# Patient Record
Sex: Female | Born: 1975
Health system: Southern US, Community
[De-identification: ages and names within clinical notes are randomized; demographics above are authoritative.]

## PROBLEM LIST (undated history)

## (undated) ENCOUNTER — Encounter (HOSPITAL_BASED_OUTPATIENT_CLINIC_OR_DEPARTMENT_OTHER): Payer: Self-pay

## (undated) ENCOUNTER — Ambulatory Visit (HOSPITAL_BASED_OUTPATIENT_CLINIC_OR_DEPARTMENT_OTHER): Payer: Self-pay | Admitting: Obstetrics & Gynecology

## (undated) DIAGNOSIS — N840 Polyp of corpus uteri: Secondary | ICD-10-CM

## (undated) DIAGNOSIS — E785 Hyperlipidemia, unspecified: Secondary | ICD-10-CM

## (undated) DIAGNOSIS — Z148 Genetic carrier of other disease: Secondary | ICD-10-CM

## (undated) DIAGNOSIS — T7840XA Allergy, unspecified, initial encounter: Secondary | ICD-10-CM

## (undated) DIAGNOSIS — R569 Unspecified convulsions: Secondary | ICD-10-CM

## (undated) DIAGNOSIS — D649 Anemia, unspecified: Secondary | ICD-10-CM

## (undated) DIAGNOSIS — E119 Type 2 diabetes mellitus without complications: Secondary | ICD-10-CM

## (undated) DIAGNOSIS — K219 Gastro-esophageal reflux disease without esophagitis: Secondary | ICD-10-CM

## (undated) DIAGNOSIS — G4730 Sleep apnea, unspecified: Secondary | ICD-10-CM

## (undated) DIAGNOSIS — Z789 Other specified health status: Secondary | ICD-10-CM

## (undated) DIAGNOSIS — M1990 Unspecified osteoarthritis, unspecified site: Secondary | ICD-10-CM

## (undated) DIAGNOSIS — F4320 Adjustment disorder, unspecified: Secondary | ICD-10-CM

## (undated) DIAGNOSIS — F32A Depression, unspecified: Secondary | ICD-10-CM

## (undated) DIAGNOSIS — E079 Disorder of thyroid, unspecified: Secondary | ICD-10-CM

## (undated) DIAGNOSIS — N946 Dysmenorrhea, unspecified: Secondary | ICD-10-CM

## (undated) DIAGNOSIS — G43909 Migraine, unspecified, not intractable, without status migrainosus: Secondary | ICD-10-CM

## (undated) DIAGNOSIS — R519 Headache, unspecified: Secondary | ICD-10-CM

## (undated) DIAGNOSIS — I10 Essential (primary) hypertension: Secondary | ICD-10-CM

## (undated) HISTORY — DX: Allergy, unspecified, initial encounter: T78.40XA

## (undated) HISTORY — DX: Gastro-esophageal reflux disease without esophagitis: K21.9

## (undated) HISTORY — PX: WISDOM TOOTH EXTRACTION: SHX21

## (undated) HISTORY — DX: Unspecified convulsions: R56.9

## (undated) HISTORY — DX: Polyp of corpus uteri: N84.0

## (undated) HISTORY — DX: Genetic carrier of other disease: Z14.8

## (undated) HISTORY — DX: Unspecified osteoarthritis, unspecified site: M19.90

## (undated) HISTORY — PX: HAND SURGERY: SHX662

## (undated) HISTORY — PX: ESOPHAGOGASTRODUODENOSCOPY ENDOSCOPY: SHX5814

## (undated) HISTORY — DX: Type 2 diabetes mellitus without complications: E11.9

## (undated) HISTORY — PX: COLONOSCOPY: SHX5228

## (undated) HISTORY — DX: Headache, unspecified: R51.9

## (undated) HISTORY — DX: Depression, unspecified: F32.A

## (undated) HISTORY — PX: PR CHOLECYSTECTOMY: 47600

## (undated) HISTORY — DX: Dysmenorrhea, unspecified: N94.6

## (undated) HISTORY — DX: Adjustment disorder, unspecified: F43.20

## (undated) HISTORY — DX: Migraine, unspecified, not intractable, without status migrainosus: G43.909

## (undated) HISTORY — PX: PR COLPOSCOPY CERVIX BX CERVIX & ENDOCRV CURRETAGE: 57454

## (undated) HISTORY — PX: PR TONSILLECTOMY & ADENOIDECTOMY <AGE 12: 42820

## (undated) HISTORY — PX: PR UNLISTED PROCEDURE INNER EAR: 69949

## (undated) HISTORY — DX: Essential (primary) hypertension: I10

## (undated) HISTORY — DX: Disorder of thyroid, unspecified: E07.9

## (undated) HISTORY — DX: Anemia, unspecified: D64.9

## (undated) SURGERY — HYSTEROSCOPY, WITH UTERINE POLYP EXCISION, USING MORCELLATOR
Anesthesia: Surgeon Has No Preference | Site: Uterus

## (undated) MED ORDER — OMEPRAZOLE 40 MG OR CPDR
40.0000 mg | DELAYED_RELEASE_CAPSULE | Freq: Every day | ORAL | 0 refills | Status: AC
Start: 2022-07-01 — End: ?

## (undated) MED ORDER — ZOLOFT 100 MG OR TABS
ORAL_TABLET | ORAL | Status: DC
Start: 1998-03-28 — End: 2021-09-09

## (undated) MED ORDER — METRONIDAZOLE 250 MG OR TABS
ORAL_TABLET | ORAL | Status: DC
Start: 1999-05-16 — End: 2021-09-09

## (undated) MED ORDER — EFFEXOR XR 37.5 MG OR CP24
EXTENDED_RELEASE_CAPSULE | ORAL | Status: AC
Start: 1998-02-21 — End: 1998-03-28

## (undated) MED ORDER — ZOLOFT 100 MG OR TABS
ORAL_TABLET | ORAL | Status: AC
Start: 1997-12-31 — End: 1998-02-21

## (undated) MED ORDER — ZOLOFT 100 MG OR TABS
ORAL_TABLET | ORAL | Status: AC
Start: 1998-02-21 — End: 1998-03-28

## (undated) MED ORDER — LEVAQUIN 250 MG OR TABS
ORAL_TABLET | ORAL | Status: DC
Start: 1998-01-15 — End: 2021-09-09

## (undated) MED ORDER — ZOLOFT 50 MG OR TABS
ORAL_TABLET | ORAL | Status: DC
Start: 1997-12-31 — End: 2021-09-09

## (undated) MED ORDER — EFFEXOR XR 37.5 MG OR CP24
EXTENDED_RELEASE_CAPSULE | ORAL | Status: AC
Start: 1998-01-22 — End: 1998-02-21

## (undated) MED ORDER — EFFEXOR XR 37.5 MG OR CP24
EXTENDED_RELEASE_CAPSULE | ORAL | Status: DC
Start: 1998-03-28 — End: 2021-09-09

## (undated) MED ORDER — TRAZODONE HCL 50 MG OR TABS
ORAL_TABLET | ORAL | Status: DC
Start: 1998-01-22 — End: 2021-09-09

## (undated) MED ORDER — ZOLOFT 50 MG OR TABS
ORAL_TABLET | ORAL | Status: AC
Start: 1997-12-03 — End: 1997-12-31

## (undated) MED ORDER — ZOLOFT 100 MG OR TABS
ORAL_TABLET | ORAL | Status: AC
Start: 1997-12-17 — End: 1997-12-31

## (undated) NOTE — H&P (Signed)
Formatting of this note is different from the original.  [facility]:  PRE-OP HISTORY AND PHYSICAL EXAM    ASSESSMENT & PLAN:    (Z01.818) Preop examination  (primary encounter diagnosis)  (N94.6) Dysmenorrhea  (N84.1) Endocervical polyp  Plan: TLH, BS, cystoscopy    Procedure, indications, and potential benefits discussed with the patient.  Risks discussed, including but not limited to:  Anesthesia complications  Death  Infection  Hemorrhage  Transfusion  Uterine/Cervical perforation  Pain/Chronic Pain  Thromboembolic events (DVT/PE)  Wound infection/separation  Injury to pelvic/abdominal organs including bowel, bladder, ureters, etc.    Alternatives and their risks discussed including:  Expectant management  Alternative medical tx: hormones, endometrial ablation     Preop medications sent via mail order.     (I10) Primary hypertension  Comment: losartan and HCTZ daily  Plan: hold losartan morning of surgery, can continue HCTZ    (E05.00) Graves' disease  Plan: continue levo daily    (G47.33) Obstructive sleep apnea (adult) (pediatric)  Comment: has not been using cpap machine    Perioperative medication management:   - Will hold losartan morning of surgery    Medical conditions optimized; patient's health status appropriate for proceeding to surgery    SUBJECTIVE INFORMATION:     Nicole [name] is a 70 year old female seen today for a pre-operative history and physical exam.     Surgery Information       Future Procedures (Tomorrow to 05/24/2022)         Date Time Procedures Providers       05/01/2022  7:15 AM HYSTERECTOMY TOTAL W BILATERAL SALPINGECTOMY, LAPAROSCPICCYSTOSCOPY Corrinne Eagle                Patient's husband will accompany on the day of surgery.    Other considerations: nothing noted          HISTORICAL INFORMATION:     Problem List  Patient Active Problem List   Diagnosis    Benign neoplasm of colon, unspecified    Conductive hearing loss, bilateral    Dysmenorrhea     Endocervical polyp    Graves' disease    Hypertension    Hyperhidrosis    Gastroesophageal reflux disease    Hypothyroidism, postablative    Morbid obesity    Marijuana use    Major depressive disorder, single episode, in partial or unspecified remission    Morbid obesity with body mass index of 40.0-44.9 in adult    Obstructive sleep apnea (adult) (pediatric)    Oligomenorrhea    PTSD (post-traumatic stress disorder)    Recurrent major depressive disorder, in partial remission    Tension headache    Chronic foot pain, left    Fibromyalgia    Vasomotor symptoms due to menopause    Varicose veins of left lower extremity with pain    Healthcare maintenance     Medications  Outpatient Medications Marked as Taking for the 04/14/22 encounter (Office Visit) with Corrinne Eagle, MD   Medication Sig Dispense Refill    losartan (COZAAR) 50 mg tablet       triamcinolone (KENALOG) 0.1 % topical ointment Apply to affected area(s) 2 times daily as needed 30 g 1    buPROPion (WELLBUTRIN SR) 150 mg sustained release tablet Take 1 tablet by mouth 2 times daily      levothyroxine 0.175 mg tablet Take 175 mcg by mouth      omeprazole (PRILOSEC) 40 mg delayed release  capsule Take 1 capsule by mouth daily      gabapentin (NEURONTIN) 300 mg capsule 600 mg daily      hydroCHLOROthiazide (HYDRODIURIL) 25 mg tablet Take 25 mg by mouth daily      aspirin-acetaminophen-caffeine (EXCEDRIN MIGRAINE) 250-250-65 mg tablet Take 1 tab by mouth      DULoxetine (CYMBALTA) 20 mg delayed release capsule Take 3 capsules by mouth daily      potassium chloride (KLOR-CON M20) 20 mEq extended release tablet 20 mEq       Current Facility-Administered Medications for the 04/14/22 encounter (Office Visit) with Corrinne Eagle, MD   Medication Dose Route Frequency Provider Last Rate Last Admin    medroxyPROGESTERone (DEPO-PROVERA) injectable 150 mg  150 mg Intramuscular Q 11-13 weeks Drucie Ip, RN   150 mg at 02/20/22 1716     Allergies  Allergies    Allergen Reactions    Nortriptyline Palpitations and Shortness of breath    Citalopram Other     Easy bruising  Easy bruising  Easy bruising  Easy bruising  Easy bruising  Easy bruising    Easy bruising Easy bruising Easy bruising    Easy bruising Easy bruising  Easy bruising     Propranolol Other     Helped palpitations but not anxiety and caused fatigue  Helped palpitations but not anxiety and caused fatigue  Helped palpitations but not anxiety and caused fatigue  Helped palpitations but not anxiety and caused fatigue  Helped palpitations but not anxiety and caused fatigue  Helped palpitations but not anxiety and caused fatigue    Helped palpitations but not anxiety and caused fatigue Helped palpitations but not anxiety and caused fatigue Helped palpitations but not anxiety and caused fatigue    Helped palpitations but not anxiety and caused fatigue Helped palpitations but not anxiety and caused fatigue  Helped palpitations but not anxiety and caused fatigue      Social History  Social History     Tobacco Use    Smoking status: Former     Types: Cigarettes     Start date: 1992     Quit date: 2013     Years since quitting: 11.2    Smokeless tobacco: Never   Substance Use Topics    Alcohol use: Not Currently     Past Medical History  Past Surgical History  Family History    Relevant history, medical conditions, medications and allergies reviewed and updated as appropriate.    RISK STRATIFICATION:     Anesthesia History: No problems with previous anesthesia    Activity and Exercise Tolerance: Moderate activity (4-5 Mets): Able to climb 2 flights of stairs or walk up a small hill without stopping from SOB; able to participate in moderate activities such as golf, bowling or dancing    Prior cardiac stress testing on file:    REVISED CARDIAC RISK INDEX  Risk Factors:  High risk type of surgery: no       (Includes vascular surgery and open intraperitoneal or intrathoracic procedures)  History of ischemic heart disease  defined as below: no  History of myocardial infarction or   Positive exercise stress test or   Current chest pain considered to be due to myocardial ischemia or   Use of nitrate therapy or   EKG with pathological Q-waves         (Prior coronary revascularization procedures do not strictly count unless 1 other criterion for ischemic heart disease is present)  History of heart  failure: no  History of cerebrovascular disease: no  Diabetes mellitus requiring treatment with insulin: no  Preoperative serum creatinine greater than 2.0: no    Interpretation:  Rate of myocardial infarction, pulmonary edema, ventricular fibrillation, primary cardiac arrest, and complete heart block:  No Risk Factors - 0.4%    PHYSICAL EXAM:     Visit Vitals  BP 132/91   Pulse 94   Temp 98.1 F (36.7 C) (Temporal)   Ht 5\' 9"  (1.753 m)   SpO2 97%   BMI 41.20 kg/m     General Appearance: awake, alert and oriented  Cardiovascular: Regular rate and rhythm. Normal S1, S2 without S3, S4, click, rubs, or murmur, bilateral carotids without bruits, extremities without edema  Lungs: clear to auscultation and percussion without crackles, rhonchi or wheezes  Extremities: normal appearance    LABORATORY STUDIES:     No order of MRSA CULTURE is found.     No results found for: "MRSA"    MRSA screening: Completed today    Lab Results   Component Value Date    A1C 5.5 02/20/2022     Recent ultrasound (09/2021) shows 2 small subserosal fundal fibroids and echogenic mass anterior cervical wall, possibly small polyp. EMB 09/2021 with [facility] - had an EMB which was normal.     PAIN MANAGEMENT:     The following counseling points were discussed today:  The goal of post-operative pain treatment is not to relieve all pain, but rather focus on improving function to allow for the ability to do basic daily activities.  First line pain treatments are Acetaminophen (Tylenol) and NSAIDs (such as ibuprofen or naproxen).  Opioids may or may not be needed for post-op pain  control.   Depending on the type of surgery/procedure, opioids may or may not be needed for post op pain control. If opioids are prescribed, it may be for only a few days, 4-7 days, or up to 14 days.    Do not expect the need for opioids longer than 2 weeks in most cases.   If opioids are prescribed, understand that there are multiple side effects including nausea, vomiting, constipation, risk of dependence, addiction, and potential for overdose.  The operating surgeon's office is responsible for prescribing narcotics if needed, up to and generally no longer than 6 weeks in most cases.   Opioid prescribing more than 6 weeks post op has been linked to long term opioid misuse/abuse and addiction potential  In the case where refills are needed, patient to call the surgeon's clinic at least 48 hours in advance. This will allow time for possible transfer of hard copy to patient's preferred [facility] pharmacy for clinic pick-up as necessary.    References: www.opioidprescribing.info   http://agencymeddirectors.FindSpin.nl.pdf     Corrinne Eagle, MD  04/14/2022      Electronically signed by Corrinne Eagle, MD at 04/14/2022  2:12 PM PDT

## (undated) NOTE — Progress Notes (Signed)
Formatting of this note is different from the original.  Visit Prep    Provider Needs to Reconcile: None    Patient's main concern: Pre-op Exam (Pre op teaching)     Other needs or issues hoping to address?   none    Care reminders were reviewed:  Yes:  none    Orders Placed This Encounter    losartan (COZAAR) 50 mg tablet     Health Maintenance Needs    - A blood test to check the level of potassium in your system.  You need   this test every year because of certain medicine(s) you take.     - A blood test to check the level of creatinine in your system.  This test   is required every year to check your kidney health, because of medicine(s)   you take.    - A follow up test for high blood pressure.      Unaddressed Conditions          Smoking Tobacco Use: Former    Medications reviewed:       Tue Apr 14, 2022  1:43 PM       Allergies reviewed:         Apr 14, 2022  1:43 PM       History updated: no changes     Questionnaires completed:       Patient offered chaperone: Not applicable    Patient mobility: Ambulatory  Patient accompanied by: self      Electronically signed by Aura Dials, MA at 04/14/2022  2:12 PM PDT

## (undated) NOTE — Progress Notes (Signed)
Formatting of this note might be different from the original.  Two pt.identifier is met for Depo-Provera 150 mg IM to be administered; please see medication administration record (MAR) for additional information. Pt.tol.injection well into the LDL. A reminder card is given to pt.w/dates of next due injection. Pt.states she was told by her provider that she doesn't have to have a negative pregnancy test prior to this injection. See provider message.  Reviewed with Inj Room Manager at [facility]. If Dr. Cathie Hoops can document in chart that it is ok to bypass urine pregnancy test, they will be able to administer. Dr. Cathie Hoops added note to today's chart note.   Per Dr. Cathie Hoops:  "urine POCT canceled as patient has not had intercourse with a female in over 10 years and most recently only had female partners. OKAY TO ADMINISTER DEPO PROVERA WITHOUT URINE POCT".   Pt.left the IR in NAD.  Rick Duff, LPN    Electronically signed by Rick Duff, LPN at 16/11/9602  5:24 PM PST

---
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
## 1997-09-06 ENCOUNTER — Ambulatory Visit (INDEPENDENT_AMBULATORY_CARE_PROVIDER_SITE_OTHER): Payer: No Typology Code available for payment source

## 1997-09-06 ENCOUNTER — Encounter (INDEPENDENT_AMBULATORY_CARE_PROVIDER_SITE_OTHER): Payer: Self-pay | Admitting: Internal Medicine

## 1997-09-06 DIAGNOSIS — T1490XA Injury, unspecified, initial encounter: Secondary | ICD-10-CM

## 1997-09-06 NOTE — Progress Notes (Addendum)
Addended by: Octavia Heir on: 09/18/1997,12:23 PM  Modules accepted: Order Summary, Change LOS

## 1997-09-06 NOTE — Nursing Note (Signed)
>>   Nicole [name], Nicole [name]     09/06/1997   3:14 pm  Nursing Back/Neck Pain    S:  Pt. was sitting on a stool when she developed midback pain.    Pain worsened with ambulation, position did not relieve pain.  Pt.   slept on the floor, on her stomach last night.  This morning when   she attempted to move the pain worsened again.  1.  The pain was sudden in onset.  yes: Was sitting on a stool when   an intense ache started in her back.  2.  The pain is secondary to trauma.  no  3.  Radiating pain?  no  4.  Numbness to extremities.  no  5.  Tingling to extremities.  no  6.  SOB?                      no  7.  Pain in inspiration?      no  8.  Pain between shoulder blades?  yes: Pt. has pain under her   right shoulder blade.  Pt. has bilateral breath sounds and no SOB.  9.  Loss of bladder or bowel control?  no  10.  Fever?   no  11.  CVA tenderness?  no  12.  Abdominal pain?  no  13.  Anxiety?         no  14.  Worker's Comp. Claim filed?  no  15.  Difficulty sleeping or normal daily function impaired?  no    Duration of problem: 12  hours    Causal Factors: Movement.     Level of Activity Before: n/a   Level of Activity After: n/a  Self treatment? yes. Ibuprofen.  Results: Some relief    O:  Location of palpable pain: None    A:  Back/neck pain  P:  1. Educated patient on theories behind ice/heat treatment.      2. Advised to use home ice massage, 3-5 minutes QID or more up   to Q hour.          (Ice packs can be substituted and applied for 30 minutes.)      3. Advised on use of OTC analgesic medications and given   appropriate information handout.      4.  Patient given back book handout with education about good   body mechanics.      5.  Rest in comfortable position.  If must sit, get up to walk   q 15 minutes.        Follow-up in 10-14  days for RECHECK. If symptoms worsen or if she   should develop fever, dysurea, radiating pain or SOB, return   sooner.

## 1997-10-12 ENCOUNTER — Ambulatory Visit (INDEPENDENT_AMBULATORY_CARE_PROVIDER_SITE_OTHER): Payer: No Typology Code available for payment source | Admitting: Family Medicine

## 1997-10-12 ENCOUNTER — Ambulatory Visit (INDEPENDENT_AMBULATORY_CARE_PROVIDER_SITE_OTHER): Payer: Self-pay | Admitting: Family Medicine

## 1997-10-12 DIAGNOSIS — S8000XA Contusion of unspecified knee, initial encounter: Secondary | ICD-10-CM

## 1997-10-12 NOTE — Progress Notes (Signed)
This 22 year old woman is here for bilateral knee pain.  S: She tripped running up stairs 10 days ago and fell onto both knees. She landed more heavily on her left knee. She noted bruising and swelling over the left knee that resolved over the ensuing 4-5 days. Two days ago she noted a small area of new bruising over the left patella; there is more pai today after going dancing last night. She continues to experience pain on stairs, kneeling, squatting, and with lots of walking. 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 She denies previous injury to either knee. She is doing no regular exercise activity over the summer; during the school she does water aerobics, which does not bother her knees. Running and aerobics do cause bilateral knee pain.  She is in good health, takes no medications, and has a negative past medical history.  O: Knee exam is similar bilaterally. There is a small, fading echymosis over the inferior left patella. There is no effusion; FROM without pain. There is tenderness over the patella without evidence of prepatellar bursal fluid. There is no joint line tenderness; McMurray test is negative. Lachman test is negative as is the anterior and posterior drawer tests. There is no pain or laxity with valgus or varus stress.  A: Contusion to the patellae, more severe on the left. There is no evidence of internal derangement.  P: Reassurance; symptomatic treatment; increase activity as tolerated.  A: Contusion

## 1997-10-12 NOTE — Nursing Note (Signed)
>>   Nicole [name], Nicole [name]     10/12/1997   10:18 am  Pt was walking up some stairs when she rammed her left knee into   the stairs.

## 1997-11-08 ENCOUNTER — Telehealth (INDEPENDENT_AMBULATORY_CARE_PROVIDER_SITE_OTHER): Payer: Self-pay | Admitting: Mental Health

## 1997-11-08 DIAGNOSIS — F4320 Adjustment disorder, unspecified: Secondary | ICD-10-CM

## 1997-11-08 NOTE — Telephone Encounter (Addendum)
Addended by: Edmonia Caprio on: 02/15/1998,6:04 PM  Modules accepted: Order Summary    Addended by: Edmonia Caprio on: 02/15/1998,6:01 PM  Modules accepted: Order Summary    Addended by: Edmonia Caprio on: 11/13/1997,4:02 PM  Modules accepted: Order Summary    Addended by: Michel Santee on: 11/12/1997,12:57 PM   Comment: Pt. called today, asked about referral, no referral yet. I told her she could CB to check though. MEK  Modules accepted: Progress Notes    D: Nicole [name] called to inquire if we have received a referral from her therapist in the community Nicole [name]) yet. I remembered receiving voice message from the therapist a couple of weeks ago - at that time the therapist did not reveal Khaila's name because ROI   was not done yet - and LMM with the therapist with our procedure of referral and fax number.     Action: I checked in the file cabinet and the computer, but no referral from her yet. LMM with Tresa Endo asking her to contact her therapist again, and to have her fax to [facility].

## 1997-11-13 DIAGNOSIS — F4320 Adjustment disorder, unspecified: Secondary | ICD-10-CM | POA: Insufficient documentation

## 1997-11-15 ENCOUNTER — Telehealth (INDEPENDENT_AMBULATORY_CARE_PROVIDER_SITE_OTHER): Payer: Self-pay | Admitting: Clinical

## 1997-11-15 DIAGNOSIS — F4320 Adjustment disorder, unspecified: Secondary | ICD-10-CM

## 1997-11-15 NOTE — Telephone Encounter (Addendum)
Addended by: Michel Santee on: 11/19/1997,10:12 AM   Comment: Made a medeval for 12/03/97 with C.Chavez see long narrative from current therapist in file cabinet alpha. MEK  Modules accepted: Order Summary, Progress Notes    Addended by: Michel Santee on: 11/19/1997,9:21 AM  Modules accepted: Progress Notes    Addended by: Michel Santee on: 11/19/1997,9:19 AM   Comment: NOTE CHECK INSURANCE Got the referral, called Tresa Endo to set up medeval LM to call beeper MEK  Modules accepted: Progress Notes    Giannamarie called again today inquiring about a referral for her by her Therapist, Beau Fanny of [facility]. She would like to see someone here to discuss about medication. After searching for the referral letter, I left her a message on her ans. machine informing her of no referral letter received in the mail or by fax from her therapist. I left her our address and fax number again.

## 1997-12-03 ENCOUNTER — Ambulatory Visit (INDEPENDENT_AMBULATORY_CARE_PROVIDER_SITE_OTHER): Payer: No Typology Code available for payment source | Admitting: Psychiatry

## 1997-12-03 DIAGNOSIS — IMO0002 Reserved for concepts with insufficient information to code with codable children: Secondary | ICD-10-CM

## 1997-12-03 DIAGNOSIS — F418 Other specified anxiety disorders: Secondary | ICD-10-CM | POA: Insufficient documentation
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
## 1997-12-17 ENCOUNTER — Ambulatory Visit (INDEPENDENT_AMBULATORY_CARE_PROVIDER_SITE_OTHER): Payer: Self-pay | Admitting: Psychiatry

## 1997-12-17 DIAGNOSIS — IMO0002 Reserved for concepts with insufficient information to code with codable children: Secondary | ICD-10-CM

## 1997-12-17 NOTE — Progress Notes (Signed)
Nicole [name] reports not doing any better. Cont to feel quite depressed. Has no energy. Sleep and appetite are O.K. Is taking several courses and is involved in a practicum at the [employer]. Recently took time off from practicum in order to "catch up" in her school work. Is Seeing [name]. Hamack in weekly therapy but was unable to see her last wk due to Nicole Hamack's illness. Relationship is going well.  O: Alert, cooperative  A: Depression persists  P: Increase med to 100mg /d Zoloft

## 1997-12-31 ENCOUNTER — Ambulatory Visit (INDEPENDENT_AMBULATORY_CARE_PROVIDER_SITE_OTHER): Payer: No Typology Code available for payment source | Admitting: Psychiatry

## 1997-12-31 DIAGNOSIS — IMO0002 Reserved for concepts with insufficient information to code with codable children: Secondary | ICD-10-CM

## 1997-12-31 NOTE — Progress Notes (Signed)
Ms C reports feeling better. Mood is "just barely normal." Sleep is fairly good altho at times has difficulty falling asleep. Appetite remains down. Energy level is adequate. Cont to see C Hamack regularly. Thanksgiving holiday was stressful. Catching up on school work and practicum. Only taking 12credits.   O: Alert, cooperative  A: Stable  P: Will increase medication slightly to 125mg  Zoloft for 2 weeks and possibly to 150mg /d.

## 1998-01-14 ENCOUNTER — Ambulatory Visit (INDEPENDENT_AMBULATORY_CARE_PROVIDER_SITE_OTHER): Payer: No Typology Code available for payment source | Admitting: Nurse Practitioner

## 1998-01-14 VITALS — BP 124/84

## 1998-01-14 DIAGNOSIS — L293 Anogenital pruritus, unspecified: Secondary | ICD-10-CM

## 1998-01-14 DIAGNOSIS — R300 Dysuria: Secondary | ICD-10-CM

## 1998-01-14 LAB — URINALYSIS COMPLETE, URINE
Bilirubin (Qual), URN: NEGATIVE
Glucose (Qual), URN: NEGATIVE g/dL
Ketones, URN: NEGATIVE mg/dL
Nitrite, URN: NEGATIVE
Occult Blood, URN: NEGATIVE
Protein (Alb Semiquant), URN: NEGATIVE mg/dL
RBC, URN: NEGATIVE /HPF
Specific Gravity, URN: 1.02 g/mL (ref 1.005–1.03)
Urobilinogen, URN: NORMAL EHRLICH UNITS
pH, URN: 6.5 (ref 5.0–8.0)

## 1998-01-14 NOTE — Progress Notes (Signed)
Nicole [name] a 22 year old female presents today for discomfort with urination on and off for the last 3 wks. Lasted 5 d then began again 2 d ago. Describes it as not really a 'burning" but at the end of urinary stream she gets the sensation she has to go some more so bears down -feels a 'pain' then relaxes and it disappears.  reports full STD testing 6 wks ago at [facility]- denies any new partners since,  No hx UTI  Meds as of 01/14/1998:  ORTHO TRI-CYCLEN TABS OR 1 TAB PO ONCE DAILY  ZOLOFT TABS 100 MG OR 1 TABLET DAILY  ZOLOFT TABS 50 MG OR one half to one tab qd    Patient's last menstrual period was 01/07/1998.  No past hx of UTI  Notes some dyspareunia with intromission inconsistently. Last IC was 4 d ago- no pain- but some introital burning after- has lessened but still present.  O:  abd: mild tenderness over suprapubic area  ext gen: shallow fissuring at ML introitus  vagina: mod amt white thick disch  cx: no inflammation  no CMT  Ut , adnexae WNL  KOH/NS:  no yeast trich or clue  P:   will observe vulvar burning- no IC for another 5 d- RTC if sx of irritation persist  call tomorrow for UA results

## 1998-01-15 ENCOUNTER — Telehealth (INDEPENDENT_AMBULATORY_CARE_PROVIDER_SITE_OTHER): Payer: Self-pay | Admitting: Nurse Practitioner

## 1998-01-15 ENCOUNTER — Other Ambulatory Visit (INDEPENDENT_AMBULATORY_CARE_PROVIDER_SITE_OTHER): Payer: Self-pay | Admitting: Nurse Practitioner

## 1998-01-15 DIAGNOSIS — N3000 Acute cystitis without hematuria: Secondary | ICD-10-CM

## 1998-01-15 NOTE — Telephone Encounter (Signed)
>>   Nicole [name] 01/15/1998 02:49 pm  spoke with Tresa Endo- she is still having sx ? a little better- 1+ WBC,   trace LE- based on sx and exam yesterday will rx- rx called to [facility]   pharmacy    >> Nicole [name] 01/15/1998 01:36 pm  left message    >> CALL RECEIVED. Contact: patient  >> Deloria Lair, MICHELLE 01/15/1998 11:48 am  Nicole [name] lost the number that was given to her yesterday. She called to   give [facility] her number to be reached today [phone] extension [phone].

## 1998-01-18 ENCOUNTER — Ambulatory Visit (INDEPENDENT_AMBULATORY_CARE_PROVIDER_SITE_OTHER): Payer: Self-pay

## 1998-01-22 ENCOUNTER — Ambulatory Visit (INDEPENDENT_AMBULATORY_CARE_PROVIDER_SITE_OTHER): Payer: No Typology Code available for payment source | Admitting: Psychiatry

## 1998-01-22 DIAGNOSIS — IMO0002 Reserved for concepts with insufficient information to code with codable children: Secondary | ICD-10-CM

## 1998-01-22 NOTE — Progress Notes (Signed)
Not doing well. Side-effects grew worse: lots of insomnia and nausea. Cut back on meds. Has felt more depressed. Has fd herself feeling very frustrated and irritated. Eating a lot. Tired w/ no energy. Got one incomplete but did well in other course. Has cont to see therapist C. Hamack weekly.   O: alert, cooperative; fighting back tears  A: Depression recurring  P: Add Effexor and Trazodone; Advise the pt to see her therapist more often, possibly twice a week.

## 1998-01-31 ENCOUNTER — Ambulatory Visit (INDEPENDENT_AMBULATORY_CARE_PROVIDER_SITE_OTHER): Payer: No Typology Code available for payment source

## 1998-01-31 ENCOUNTER — Ambulatory Visit (INDEPENDENT_AMBULATORY_CARE_PROVIDER_SITE_OTHER): Payer: Self-pay | Admitting: Nurse Practitioner

## 1998-01-31 ENCOUNTER — Ambulatory Visit (INDEPENDENT_AMBULATORY_CARE_PROVIDER_SITE_OTHER): Payer: No Typology Code available for payment source | Admitting: Psychiatry

## 1998-01-31 ENCOUNTER — Ambulatory Visit (INDEPENDENT_AMBULATORY_CARE_PROVIDER_SITE_OTHER): Payer: No Typology Code available for payment source | Admitting: Sports Medicine

## 1998-01-31 DIAGNOSIS — IMO0002 Reserved for concepts with insufficient information to code with codable children: Secondary | ICD-10-CM

## 1998-01-31 DIAGNOSIS — M79609 Pain in unspecified limb: Secondary | ICD-10-CM

## 1998-01-31 NOTE — Progress Notes (Signed)
Note dict    Transcription accepted by Carolann Littler on 02/05/1998 at 10:25 AM  ------  S: Nicole [name] is a 22 year old female who presents with a 3-day history of left foot pain. She cannot recall any specific trauma. It hurts her with first few steps in the morning, and most recently has become painful with nearly every step. She has noticed some swelling along the medial aspect of her arch and left foot. No history of ankle instability or distant history of trauma. 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 She has otherwise been in good health. She is followed for depression. No history of connective tissue disease or other myalgias or arthralgias.     O: There is a moderate pes planus. There is some swelling along the medial aspect of the calcaneus, near the insertion of the plantar fascia. Insertion point is exquisitely tender to touch. No excessive warmth. No pain with active motion. There is full range of motion of the ankle, with plantar and dorsiflexion, eversion and inversion. There is some increased tenderness to palpation along the insertion with the foot in dorsiflexion. No anterior joint line tenderness. No retrocalcaneal bursal tenderness.     A: Acute plantar fasciitis, possible partial rupture.    P: Immobilization, utilizing an 3-D walker.   Ankle range of motion, 2-3 times a day.   Ice, 2-3 times a day, 15-20 minutes.   Ibuprofen 800 mg TID with food.   Recheck 10 days.    Carolann Littler, MD  Transcribed 02/04/1998 by dd

## 1998-01-31 NOTE — Progress Notes (Signed)
Much better. Feels the combination of Zoloft and Effexor are working much better. Mood is much improved. Sleep is O.K. Occ problematic. Appetite is up and is eating more than she wants. Energy is better too. Visited w/ Mother over [place] and the visit was short and went well. Will be enrolling in Winter qtr w/ 15credits. Is looking forward to it. Has appt w/ Ms [facility] on 02/06/98.  O: Alert, cooperative, pleasant  A: Stable  P: Cont on Zoloft 100mg /d and Effexor 37.5mg /d. Will stop Trazadone since it was not helpful to her sleep.

## 1998-01-31 NOTE — Nursing Note (Signed)
>>   Nicole [name], Nicole [name]     01/31/1998   11:11 am  Pt seen by Dr. Ignacia Palma.  Pt fitted with western walker and given   an appointment for f/u with Dr. Ignacia Palma in about 10 days.    >> Nicole [name], Nicole [name]     01/31/1998   10:37 am  On Tuesday morning when patient got out of bed and stepped onto her   feet she noted severe pain in her left foot.  She thought it would   get better but it has not.  It does not hurt when she is not using   her feet. She has tried ibuprofen and ice without the pain going   away.  The pain has been severe each morning since Tuesday and   continues through out her day.  Pt states she is on her feet Tues,   Wed and Thurs for work then she is more sedentary.

## 1998-02-11 ENCOUNTER — Ambulatory Visit (INDEPENDENT_AMBULATORY_CARE_PROVIDER_SITE_OTHER): Payer: Self-pay | Admitting: Sports Medicine

## 1998-02-11 DIAGNOSIS — M722 Plantar fascial fibromatosis: Secondary | ICD-10-CM

## 1998-02-11 NOTE — Progress Notes (Signed)
Note dictated    Transcription accepted by Carolann Littler on 02/14/1998 at 8:22 AM  ------  S: Nicole [name] is a 23 year old female who returns today for follow-up of her acute plantar fasciitis. She has had significant improvement in a short-walking boot. She has had some discomfort with the top of the strap rubbing on her calf. She denies any calf swelling. She still has a bit of puffiness along the medial aspect of the calcaneus, but has much less pain. She has been walking in the boot without a limp. No stretching to date.     O: No real tenderness over the insertion of the plantar fascia as previous. There is some slight puffiness, but no bruising. She has tight heel cords bilaterally.     A: (1) Plantar fasciitis, acute heel pain, improved.    P: (1) Discontinue short-walking boot.      (2) Heel cups.     (3) Stretching program reviewed.     (4) Recheck three weeks.      (5) Ice prn pain.     Carolann Littler, M.D.  Rogelia Boga  Transcribed on: 02/13/1998

## 1998-02-21 ENCOUNTER — Ambulatory Visit (INDEPENDENT_AMBULATORY_CARE_PROVIDER_SITE_OTHER): Payer: No Typology Code available for payment source | Admitting: Psychiatry

## 1998-02-21 DIAGNOSIS — IMO0002 Reserved for concepts with insufficient information to code with codable children: Secondary | ICD-10-CM

## 1998-02-21 NOTE — Progress Notes (Signed)
Reports doing well. Still having some problems going to sleep and often awakens feeling tired. Admits to worrying and some tension at bedtime. Appetite is fine; may be eating more than she would like. Mood is fairly good. Tired at times. Cont to see C. Hamack on a regular basis____weekly. Has decided to drop 2 classes; not able to keep up. Will now be taking 10credits.   O: Alert, cooperative  A: Depression still evident.  P: Will cont on same meds and doses. Pt reluctant to change regimen. We discussed sleep difficulties and sleep hygiene. Gave pt several suggestions and will monitor over the next month. Also pt encouraged to get more regular exercise.

## 1998-03-08 ENCOUNTER — Encounter (INDEPENDENT_AMBULATORY_CARE_PROVIDER_SITE_OTHER): Payer: Self-pay | Admitting: Sports Medicine

## 1998-03-14 ENCOUNTER — Encounter (INDEPENDENT_AMBULATORY_CARE_PROVIDER_SITE_OTHER): Payer: Self-pay | Admitting: Sports Medicine

## 1998-03-28 ENCOUNTER — Telehealth (INDEPENDENT_AMBULATORY_CARE_PROVIDER_SITE_OTHER): Payer: Self-pay | Admitting: Psychiatry

## 1998-03-28 NOTE — Telephone Encounter (Signed)
CALL RECEIVED. Contact:  Pt calling for med refills. Last seen 02/03/98. To resched f/u appt.     Tanna Savoy, MD  Started and completed on Thu Mar 28, 1998 12:03 PM  ------------------------------

## 1998-04-04 ENCOUNTER — Ambulatory Visit (INDEPENDENT_AMBULATORY_CARE_PROVIDER_SITE_OTHER): Payer: Self-pay | Admitting: Psychiatry

## 1998-04-04 NOTE — Progress Notes (Signed)
Pt failed appt.  Charged.

## 1998-04-05 ENCOUNTER — Ambulatory Visit (INDEPENDENT_AMBULATORY_CARE_PROVIDER_SITE_OTHER): Payer: Self-pay | Admitting: Sports Medicine

## 1998-04-11 ENCOUNTER — Ambulatory Visit (INDEPENDENT_AMBULATORY_CARE_PROVIDER_SITE_OTHER): Payer: Self-pay | Admitting: Sports Medicine

## 1998-04-15 ENCOUNTER — Ambulatory Visit (INDEPENDENT_AMBULATORY_CARE_PROVIDER_SITE_OTHER): Payer: Self-pay | Admitting: Psychiatry

## 1998-04-15 ENCOUNTER — Ambulatory Visit (INDEPENDENT_AMBULATORY_CARE_PROVIDER_SITE_OTHER): Payer: Self-pay

## 1998-04-15 NOTE — Progress Notes (Addendum)
Addended by: Tanna Savoy on: 06/24/1998,1:01 PM  Modules accepted: Order Summary, Progress Notes    Addended by: Tanna Savoy on: 06/24/1998,12:52 PM  Modules accepted: Order Summary, Progress Notes    Addended by: Tanna Savoy on: 05/27/1998,8:48 AM   Comment: April 24,2000 Will cancel the charge for missed appt as the pt disputes that she had an appt on this day.  Modules accepted: Order Summary, Progress Notes, Change LOS    Pt failed appt. Charged for missed appt.

## 1998-05-29 ENCOUNTER — Encounter (INDEPENDENT_AMBULATORY_CARE_PROVIDER_SITE_OTHER): Payer: Self-pay | Admitting: Nurse Practitioner

## 1998-05-29 NOTE — Progress Notes (Signed)
Release of Information    [facility] Phone: [phone]  [place] Fax: [phone]  [id number]  [place], [place] [address]    May 29, 1998    To:    Name: [facility]  Address: [address] [place]: [place], [place]: [place], Zip: [address]    Dear Vladimir Crofts are copies of the medical records you requested on:    Nicole [name], 09-14-1975, [id number]    PLEASE NOTE:     The attached information has been disclosed to you from records whose confidentiality is protected by state and federal law. State and federal laws prohibit you from making any further disclosure of these records without the specific written consent of the person to whom it pertains, or as otherwise permitted by state law.     A General Authorization is NOT sufficient for this purpose.    Please feel free to contact the Health Data Services Department if you have any questions. Our hours of operation are Monday - Friday from 8:00 AM to 5:00 PM.    Sincerely    [facility] Information Technician

## 1998-05-31 ENCOUNTER — Ambulatory Visit (INDEPENDENT_AMBULATORY_CARE_PROVIDER_SITE_OTHER): Payer: Self-pay | Admitting: Sports Medicine

## 1998-09-23 ENCOUNTER — Other Ambulatory Visit: Admission: RE | Admit: 1998-09-23 | Discharge: 1998-09-23 | Payer: Self-pay | Admitting: Obstetrics and Gynecology

## 1999-02-03 HISTORY — PX: CHOLECYSTECTOMY: SHX55

## 1999-05-16 ENCOUNTER — Encounter (INDEPENDENT_AMBULATORY_CARE_PROVIDER_SITE_OTHER): Payer: Self-pay | Admitting: Nurse Practitioner

## 1999-05-16 ENCOUNTER — Ambulatory Visit (INDEPENDENT_AMBULATORY_CARE_PROVIDER_SITE_OTHER): Payer: No Typology Code available for payment source | Admitting: Nurse Practitioner

## 1999-05-16 DIAGNOSIS — N760 Acute vaginitis: Secondary | ICD-10-CM

## 1999-05-16 DIAGNOSIS — N930 Postcoital and contact bleeding: Secondary | ICD-10-CM

## 1999-05-16 LAB — PR WET PREP/KOH/TEST, ONSITE

## 1999-05-16 NOTE — Progress Notes (Signed)
Nicole [name] is here today with c/o vag. bleeding that she noted post last sexual activity 05/14/99. She states her LMP was well over when this occurred. She states she is sa with a female and sexual activity included penetration with hand/fingers. She states she did not look at the genital area after she bleeding was noted and states she did not have a particularly sensitive area after that either. She states she has noted some itching in the genital area off and on but no other sxs of vag. inf.  Review of patient's family history indicates:   Cancer Mother    Comment: ovarian cancer-dx 35-hyster and ok now   Cancer Paternal Grandfather    Comment: ?lung cancer   Diabetes NEG    Heart  NEG    Hypertension Father    Hypertension Mother    Lipids Father     Osteoporosis NEG    Psych Mother     Stroke NEG    Thyroid  Mother     Review of patient's past medical history indicates:   ADJUSTMENT REACTION NOS    Comment: current hx of depression tx with Wellbutrin   ABNORMAL PAP SMEAR-CERVIX 04/1998    Comment: secondary to cervicitis-tx and then normal   DYSMENORRHEA    Comment: NSAIDS  Review of patient's past surgical history indicates:   REMOVE TONSILS/ADENOIDS,<12 Y/O 1979 &81    Comment: T & A < 12yo   INNER EAR SURGERY PROC UNLISTED 1988 &90    Comment: 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 labia minora in the ant. portion of the labia.  BUS:wnl  JXB:JYNW, min. erythema at vag. introitus, min. thin, malodorous dc in vault  GN:FAOZ, nullip, clear  Wet [place]:Pos-clue,  pos-sniff, Neg-Trich and yeast  A:Suspect source of the bleeding is the laceration -now healed on the medial aspect of the ant. labia minora, BV inf. present.  P:Rx for metronidazole 250 mg, #28, 2 po bid x7 days , no etoh advised, written info. on BV inf. given, suggested sitz bath for continued healing and soothing of laceration.

## 1999-06-03 ENCOUNTER — Encounter: Payer: Self-pay | Admitting: *Deleted

## 1999-06-03 ENCOUNTER — Encounter: Admission: RE | Admit: 1999-06-03 | Discharge: 1999-06-03 | Payer: Self-pay | Admitting: *Deleted

## 1999-06-27 ENCOUNTER — Encounter (INDEPENDENT_AMBULATORY_CARE_PROVIDER_SITE_OTHER): Payer: Self-pay | Admitting: Specialist

## 1999-06-27 ENCOUNTER — Ambulatory Visit (HOSPITAL_COMMUNITY): Admission: RE | Admit: 1999-06-27 | Discharge: 1999-06-28 | Payer: Self-pay | Admitting: General Surgery

## 1999-12-29 ENCOUNTER — Encounter: Payer: Self-pay | Admitting: Registered Nurse

## 2000-01-06 ENCOUNTER — Encounter: Payer: Self-pay | Admitting: Registered Nurse

## 2000-01-16 ENCOUNTER — Other Ambulatory Visit (INDEPENDENT_AMBULATORY_CARE_PROVIDER_SITE_OTHER): Payer: Self-pay

## 2000-01-16 DIAGNOSIS — Z111 Encounter for screening for respiratory tuberculosis: Secondary | ICD-10-CM

## 2000-01-19 ENCOUNTER — Other Ambulatory Visit (INDEPENDENT_AMBULATORY_CARE_PROVIDER_SITE_OTHER): Payer: No Typology Code available for payment source

## 2000-01-19 DIAGNOSIS — Z111 Encounter for screening for respiratory tuberculosis: Secondary | ICD-10-CM

## 2000-01-19 LAB — PR PPD/TUBERCULIN SKIN TEST 5 UNITS / 0.1 ML INTRADERMAL

## 2000-02-26 ENCOUNTER — Encounter: Payer: Self-pay | Admitting: Registered Nurse

## 2000-03-23 ENCOUNTER — Encounter: Payer: No Typology Code available for payment source | Admitting: Gastroenterology

## 2000-03-26 ENCOUNTER — Encounter: Payer: Self-pay | Admitting: Internal Medicine

## 2000-04-13 ENCOUNTER — Telehealth (INDEPENDENT_AMBULATORY_CARE_PROVIDER_SITE_OTHER): Payer: Self-pay | Admitting: Nurse Practitioner

## 2000-04-13 NOTE — Telephone Encounter (Signed)
>>   Nicole [name] Tue Apr 13, 2000 5:26 PM  >> CALL RECEIVED. Contact: pt  Pt. complains of nausea, vomiting and fever today. She has vomited twice.  No diarrhea. Has had very few fluids. Denies any abdominal or flank pain.   Temp has been 100-102.  Advised fluids such as ginger ale, popsicles, broth etc in small amts frequently but advised she needs to be seen if fever persists.  I called back about 1 1/2 hrs later and she states temp is below 100 and she is taking fluids. No more vomiting.   Call if not continuing to improve.   Sonny Masters RN.C.

## 2000-06-04 ENCOUNTER — Encounter: Payer: No Typology Code available for payment source | Admitting: Registered Nurse

## 2000-07-22 ENCOUNTER — Encounter: Payer: No Typology Code available for payment source | Admitting: Internal Medicine

## 2000-07-28 ENCOUNTER — Encounter: Payer: Self-pay | Admitting: Registered Nurse

## 2000-08-04 ENCOUNTER — Encounter: Payer: Self-pay | Admitting: Registered Nurse

## 2000-08-31 ENCOUNTER — Encounter: Payer: Self-pay | Admitting: Registered Nurse

## 2000-11-08 ENCOUNTER — Encounter: Payer: Self-pay | Admitting: Registered Nurse

## 2000-11-09 ENCOUNTER — Other Ambulatory Visit (HOSPITAL_BASED_OUTPATIENT_CLINIC_OR_DEPARTMENT_OTHER): Payer: Self-pay | Admitting: Registered Nurse

## 2000-11-09 ENCOUNTER — Encounter: Payer: No Typology Code available for payment source | Admitting: Registered Nurse

## 2000-11-10 ENCOUNTER — Encounter: Payer: No Typology Code available for payment source | Admitting: Ophthalmology

## 2000-11-17 LAB — CERVICAL CANCER SCREENING: Cytologic Impression: NEGATIVE

## 2000-11-19 ENCOUNTER — Ambulatory Visit (INDEPENDENT_AMBULATORY_CARE_PROVIDER_SITE_OTHER): Payer: Self-pay | Admitting: Registered Nurse

## 2000-11-22 ENCOUNTER — Encounter: Payer: Self-pay | Admitting: Internal Medicine

## 2000-12-09 ENCOUNTER — Encounter: Payer: Self-pay | Admitting: Optometrist

## 2000-12-09 ENCOUNTER — Encounter: Payer: Self-pay | Admitting: Ophthalmology

## 2000-12-16 ENCOUNTER — Encounter: Payer: No Typology Code available for payment source | Admitting: Ophthalmology

## 2000-12-23 ENCOUNTER — Encounter: Payer: No Typology Code available for payment source | Admitting: Optometrist

## 2001-02-28 ENCOUNTER — Encounter: Payer: Self-pay | Admitting: Registered Nurse

## 2001-03-01 ENCOUNTER — Encounter: Payer: Self-pay | Admitting: Registered Nurse

## 2001-03-07 ENCOUNTER — Encounter: Payer: No Typology Code available for payment source | Admitting: Registered Nurse

## 2001-04-04 ENCOUNTER — Encounter: Payer: No Typology Code available for payment source | Admitting: Rehabilitative and Restorative Service Providers"

## 2001-04-06 ENCOUNTER — Encounter: Payer: No Typology Code available for payment source | Admitting: Rehabilitative and Restorative Service Providers"

## 2001-04-13 ENCOUNTER — Encounter: Payer: No Typology Code available for payment source | Admitting: Rehabilitative and Restorative Service Providers"

## 2001-04-18 ENCOUNTER — Encounter: Payer: Self-pay | Admitting: Rehabilitative and Restorative Service Providers"

## 2001-04-19 ENCOUNTER — Encounter: Payer: Self-pay | Admitting: Rehabilitative and Restorative Service Providers"

## 2001-04-25 ENCOUNTER — Encounter: Payer: No Typology Code available for payment source | Admitting: Rehabilitative and Restorative Service Providers"

## 2001-05-09 ENCOUNTER — Encounter: Payer: No Typology Code available for payment source | Admitting: Rehabilitative and Restorative Service Providers"

## 2001-09-14 ENCOUNTER — Encounter: Payer: No Typology Code available for payment source | Admitting: Registered Nurse

## 2001-11-18 ENCOUNTER — Encounter: Admission: RE | Admit: 2001-11-18 | Discharge: 2001-11-18 | Payer: Self-pay | Admitting: Family Medicine

## 2001-11-18 ENCOUNTER — Encounter: Payer: Self-pay | Admitting: Family Medicine

## 2001-12-06 ENCOUNTER — Encounter: Payer: No Typology Code available for payment source | Admitting: Anatomic Pathology & Clinical Pathology

## 2002-01-11 ENCOUNTER — Encounter: Payer: No Typology Code available for payment source | Admitting: Internal Medicine

## 2002-01-12 ENCOUNTER — Encounter: Payer: No Typology Code available for payment source | Admitting: Internal Medicine

## 2002-01-19 ENCOUNTER — Encounter: Payer: No Typology Code available for payment source | Admitting: Internal Medicine

## 2002-03-02 ENCOUNTER — Encounter: Payer: PRIVATE HEALTH INSURANCE | Admitting: Internal Medicine

## 2002-03-06 ENCOUNTER — Encounter: Payer: Self-pay | Admitting: Otolaryngology

## 2002-03-08 ENCOUNTER — Encounter: Payer: PRIVATE HEALTH INSURANCE | Admitting: Internal Medicine

## 2002-03-14 ENCOUNTER — Other Ambulatory Visit (HOSPITAL_BASED_OUTPATIENT_CLINIC_OR_DEPARTMENT_OTHER): Payer: Self-pay | Admitting: Internal Medicine

## 2002-04-05 LAB — CERVICAL CANCER SCREENING

## 2002-04-07 ENCOUNTER — Encounter: Payer: No Typology Code available for payment source | Admitting: Registered Nurse

## 2002-05-01 ENCOUNTER — Encounter: Payer: Self-pay | Admitting: Registered Nurse

## 2002-05-10 ENCOUNTER — Ambulatory Visit: Payer: PRIVATE HEALTH INSURANCE

## 2002-05-31 ENCOUNTER — Encounter: Payer: PRIVATE HEALTH INSURANCE | Admitting: Otolaryngology

## 2002-06-12 ENCOUNTER — Encounter: Payer: PRIVATE HEALTH INSURANCE | Admitting: Otolaryngology

## 2002-06-18 ENCOUNTER — Encounter: Payer: Self-pay | Admitting: Emergency Medicine

## 2002-06-18 ENCOUNTER — Emergency Department (HOSPITAL_COMMUNITY): Admission: EM | Admit: 2002-06-18 | Discharge: 2002-06-18 | Payer: Self-pay

## 2002-06-27 ENCOUNTER — Ambulatory Visit: Payer: PRIVATE HEALTH INSURANCE

## 2002-07-05 ENCOUNTER — Encounter: Payer: PRIVATE HEALTH INSURANCE | Admitting: Otolaryngology

## 2002-07-12 ENCOUNTER — Encounter: Payer: PRIVATE HEALTH INSURANCE | Admitting: Otolaryngology

## 2002-07-31 ENCOUNTER — Encounter: Payer: PRIVATE HEALTH INSURANCE | Admitting: Otolaryngology

## 2002-08-29 ENCOUNTER — Encounter: Payer: PRIVATE HEALTH INSURANCE | Admitting: Registered Nurse

## 2002-09-05 ENCOUNTER — Encounter: Payer: No Typology Code available for payment source | Admitting: Internal Medicine

## 2002-09-12 ENCOUNTER — Encounter: Payer: Self-pay | Admitting: Registered Nurse

## 2002-09-12 ENCOUNTER — Encounter: Payer: No Typology Code available for payment source | Admitting: Registered Nurse

## 2002-10-19 ENCOUNTER — Encounter: Payer: No Typology Code available for payment source | Admitting: Registered Nurse

## 2002-11-14 ENCOUNTER — Encounter: Payer: No Typology Code available for payment source | Admitting: Otolaryngology

## 2003-01-23 ENCOUNTER — Encounter: Payer: No Typology Code available for payment source | Admitting: Registered Nurse

## 2003-02-20 ENCOUNTER — Encounter: Payer: No Typology Code available for payment source | Admitting: Registered Nurse

## 2003-03-08 ENCOUNTER — Encounter: Payer: No Typology Code available for payment source | Admitting: Otolaryngology

## 2003-04-03 ENCOUNTER — Encounter: Payer: No Typology Code available for payment source | Admitting: Registered Nurse

## 2003-04-27 ENCOUNTER — Encounter: Payer: No Typology Code available for payment source | Admitting: Internal Medicine

## 2003-05-11 ENCOUNTER — Other Ambulatory Visit: Admission: RE | Admit: 2003-05-11 | Discharge: 2003-05-11 | Payer: Self-pay | Admitting: Gynecology

## 2003-05-18 ENCOUNTER — Encounter: Payer: No Typology Code available for payment source | Admitting: Hematology & Oncology

## 2003-06-24 ENCOUNTER — Other Ambulatory Visit (HOSPITAL_BASED_OUTPATIENT_CLINIC_OR_DEPARTMENT_OTHER): Payer: Self-pay | Admitting: Obstetrics & Gynecology

## 2003-06-25 ENCOUNTER — Other Ambulatory Visit (HOSPITAL_BASED_OUTPATIENT_CLINIC_OR_DEPARTMENT_OTHER): Payer: Self-pay | Admitting: Obstetrics & Gynecology

## 2003-06-25 ENCOUNTER — Encounter: Payer: No Typology Code available for payment source | Admitting: Obstetrics & Gynecology

## 2003-06-27 LAB — PATHOLOGY, SURGICAL

## 2003-06-28 LAB — CERVICAL CANCER SCREENING

## 2005-10-09 ENCOUNTER — Other Ambulatory Visit: Admission: RE | Admit: 2005-10-09 | Discharge: 2005-10-09 | Payer: Self-pay | Admitting: Gynecology

## 2006-07-01 ENCOUNTER — Other Ambulatory Visit: Admission: RE | Admit: 2006-07-01 | Discharge: 2006-07-01 | Payer: Self-pay | Admitting: Gynecology

## 2007-07-08 ENCOUNTER — Other Ambulatory Visit: Admission: RE | Admit: 2007-07-08 | Discharge: 2007-07-08 | Payer: Self-pay | Admitting: Gynecology

## 2008-01-04 ENCOUNTER — Ambulatory Visit: Payer: Self-pay | Admitting: Gynecology

## 2008-01-11 ENCOUNTER — Ambulatory Visit: Payer: Self-pay | Admitting: Gynecology

## 2008-04-05 ENCOUNTER — Ambulatory Visit: Payer: Self-pay | Admitting: Gynecology

## 2008-07-04 ENCOUNTER — Ambulatory Visit: Payer: Self-pay | Admitting: Gynecology

## 2008-07-25 ENCOUNTER — Other Ambulatory Visit: Admission: RE | Admit: 2008-07-25 | Discharge: 2008-07-25 | Payer: Self-pay | Admitting: Gynecology

## 2008-07-25 ENCOUNTER — Encounter: Payer: Self-pay | Admitting: Gynecology

## 2008-07-25 ENCOUNTER — Ambulatory Visit: Payer: Self-pay | Admitting: Gynecology

## 2008-10-05 ENCOUNTER — Ambulatory Visit: Payer: Self-pay | Admitting: Gynecology

## 2009-01-03 ENCOUNTER — Ambulatory Visit: Payer: Self-pay | Admitting: Gynecology

## 2009-04-03 ENCOUNTER — Ambulatory Visit: Payer: Self-pay | Admitting: Gynecology

## 2009-05-17 ENCOUNTER — Emergency Department (HOSPITAL_COMMUNITY): Admission: EM | Admit: 2009-05-17 | Discharge: 2009-05-17 | Payer: Self-pay | Admitting: Emergency Medicine

## 2009-05-27 ENCOUNTER — Encounter: Admission: RE | Admit: 2009-05-27 | Discharge: 2009-05-27 | Payer: Self-pay | Admitting: Diagnostic Neuroimaging

## 2009-07-03 ENCOUNTER — Ambulatory Visit: Payer: Self-pay | Admitting: Gynecology

## 2009-09-09 ENCOUNTER — Ambulatory Visit: Payer: Self-pay | Admitting: Gynecology

## 2009-09-09 ENCOUNTER — Other Ambulatory Visit: Admission: RE | Admit: 2009-09-09 | Discharge: 2009-09-09 | Payer: Self-pay | Admitting: Gynecology

## 2010-01-09 ENCOUNTER — Emergency Department (HOSPITAL_COMMUNITY): Admission: EM | Admit: 2010-01-09 | Discharge: 2009-06-21 | Payer: Self-pay | Admitting: Emergency Medicine

## 2010-04-21 LAB — POCT I-STAT, CHEM 8
BUN: 16 mg/dL (ref 6–23)
Calcium, Ion: 1.07 mmol/L — ABNORMAL LOW (ref 1.12–1.32)
Chloride: 109 mEq/L (ref 96–112)
Creatinine, Ser: 0.8 mg/dL (ref 0.4–1.2)
Glucose, Bld: 152 mg/dL — ABNORMAL HIGH (ref 70–99)
HCT: 40 % (ref 36.0–46.0)
Hemoglobin: 13.6 g/dL (ref 12.0–15.0)
Potassium: 3.9 mEq/L (ref 3.5–5.1)
Sodium: 139 mEq/L (ref 135–145)
TCO2: 20 mmol/L (ref 0–100)

## 2010-04-22 LAB — CBC
Hemoglobin: 13 g/dL (ref 12.0–15.0)
RBC: 4.23 MIL/uL (ref 3.87–5.11)
WBC: 8.9 10*3/uL (ref 4.0–10.5)

## 2010-04-22 LAB — BASIC METABOLIC PANEL
Calcium: 9.1 mg/dL (ref 8.4–10.5)
Chloride: 108 mEq/L (ref 96–112)
Creatinine, Ser: 0.92 mg/dL (ref 0.4–1.2)
GFR calc Af Amer: >60 mL/min (ref >60)
Sodium: 139 mEq/L (ref 135–145)

## 2010-04-22 LAB — URINALYSIS, ROUTINE W REFLEX MICROSCOPIC
Glucose, UA: NEGATIVE mg/dL
Hgb urine dipstick: NEGATIVE
Specific Gravity, Urine: 1.015 (ref 1.005–1.030)
Urobilinogen, UA: 0.2 mg/dL (ref 0.0–1.0)
pH: 5 (ref 5.0–8.0)

## 2010-04-22 LAB — POCT PREGNANCY, URINE: Preg Test, Ur: NEGATIVE

## 2010-04-22 LAB — DIFFERENTIAL
Lymphs Abs: 2.2 10*3/uL (ref 0.7–4.0)
Monocytes Relative: 3 % (ref 3–12)
Neutro Abs: 6.2 10*3/uL (ref 1.7–7.7)
Neutrophils Relative %: 70 % (ref 43–77)

## 2010-06-20 NOTE — Op Note (Signed)
[facility]  Patient:    Terri Hunt, Terri Hunt                    MRN: [id number] Proc. Date: 06/27/99 Adm. Date:  [id number] Disc. Date: [id number] Attending:  Carson Myrtle                           Operative Report  PREOPERATIVE DIAGNOSIS:  Chronic cholecystolithiasis.  POSTOPERATIVE DIAGNOSIS:  Chronic cholecystolithiasis.  PROCEDURE:  Laparoscopic cholecystectomy.  ANESTHESIA:  General.  INDICATION:  Ms. [name] is 47, very obese but has lost 112 pounds in the last year.  Has developed right upper quadrant pain, nausea and vomiting, usually postprandial, on several occasions.  She is a gravida 0.  Workup by her family doctor revealed gallstones, normal liver function studies.  She has been carefully counseled, explained, and wishes to proceed with the laparoscopic cholecystectomy.  DESCRIPTION OF PROCEDURE:  Patient brought to the operating room, general endotracheal anesthesia administered.  The abdomen was scrubbed, prepped and draped in the usual fashion.  A vertical infraumbilical incision made.  A very deep layer of fat tissue was dissected.  The fascia was identified, opened in the midline vertically.  The posterior fascia and peritoneum entered. Peritoneum was normal.  There were no complications.  A Hasson catheter placed and tied in place with a #1 Vicryl.  The abdomen insufflated nicely.  The patient positioned.  General peritoneoscopy was unremarkable.  The gallbladder appeared indolent, perhaps a bit thickened.  A second 10 mm trocar was placed in the midepigastrium, two 5 mm trocars in the right upper quadrant. Appropriate instruments applied, the gallbladder grasped, placed on tension, the second grasper placed at the infundibulum of the gallbladder and inferior and lateral traction held.  The peritoneum was actually thin, and a normal-appearing cystic duct could be seen through the peritoneum as well and artery.  These were  carefully dissected out right up to the gallbladder.  They were then triply clipped and divided.  The base of the gallbladder was then easily pulled away from the gallbladder bed.  A small posterior artery was identified, clipped, divided, and the gallbladder was removed from the gallbladder bed, one puncture made and a small bile leak.  The gallbladder was placed in an Endocatch bag, and then inspection of all sites was made under direct vision, copious irrigation carried out.  The gallbladder was then removed through the infraumbilical incision, which was closed and tied with a #1 Vicryl under direct vision.  Further irrigation was carried out and again, no complications, no blood, no bile leak.  The trocar sites were carefully visualized.  The lateral 5 mm site had some superficial bleeding that was controlled, and careful inspection both intra-abdominally and on the skin surface was made.  Likewise, the other sites were observed, and there were no complications.  The abdomen was relieved of CO2, instruments, irrigant.  All trocars, of course, had been removed.  Marcaine 0.5% with epinephrine had been injected prior to insertion.  Skin incisions closed with 4-0 Monocryl.  She tolerated it well.  Counts were correct.  She was awakened and taken to the recovery room in good condition. DD:  06/27/99 TD:  07/01/99 Job: [id number]

## 2010-08-01 DIAGNOSIS — N926 Irregular menstruation, unspecified: Secondary | ICD-10-CM | POA: Insufficient documentation

## 2010-08-01 DIAGNOSIS — I10 Essential (primary) hypertension: Secondary | ICD-10-CM | POA: Insufficient documentation

## 2010-08-01 DIAGNOSIS — K219 Gastro-esophageal reflux disease without esophagitis: Secondary | ICD-10-CM | POA: Insufficient documentation

## 2010-08-01 DIAGNOSIS — M545 Low back pain, unspecified: Secondary | ICD-10-CM

## 2010-10-30 ENCOUNTER — Other Ambulatory Visit: Payer: Self-pay | Admitting: *Deleted

## 2010-10-30 MED ORDER — NYSTATIN-TRIAMCINOLONE 100000-0.1 UNIT/GM-% EX CREA: TOPICAL_CREAM | Freq: Two times a day (BID) | CUTANEOUS | Status: AC

## 2010-10-30 NOTE — Telephone Encounter (Signed)
Pt has not yet scheduled her annual.

## 2011-05-27 IMAGING — CT CT HEAD W/O CM
1 of 2 series · 13 of 30 positions shown, 17 images · non-contrast
Comparison: None

CLINICAL DATA: Seizure.

CT HEAD WITHOUT CONTRAST
TECHNIQUE: Contiguous axial images were obtained from the base of
the skull through the vertex without contrast.

[Series 2: brain · axial · 0.47mm/px · z∈[-127,+14]mm · 13 of 32 slices shown, 17 images]
[im 3/32  brain]
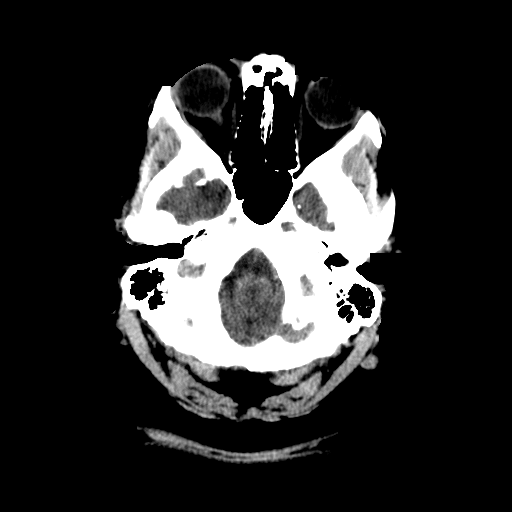
[im 3/32  bone]
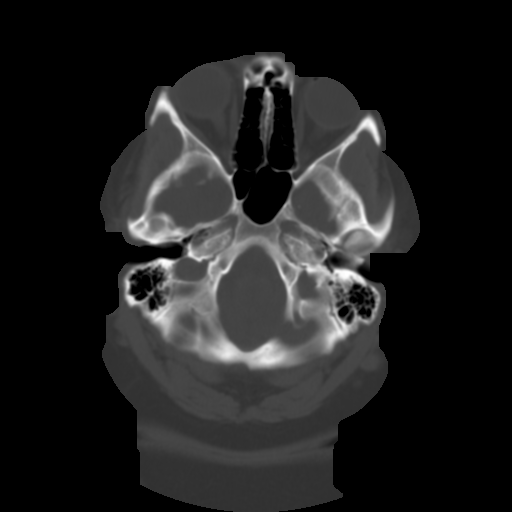
[im 5/32  brain]
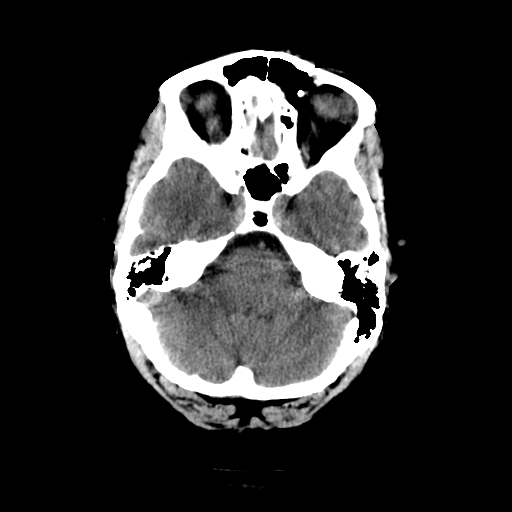
[im 7/32  brain]
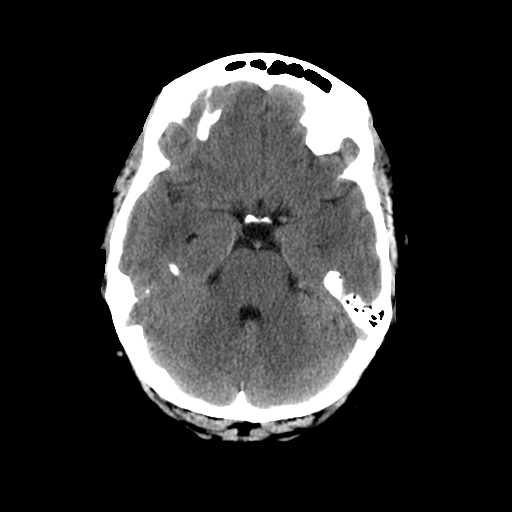
[im 9/32  brain]
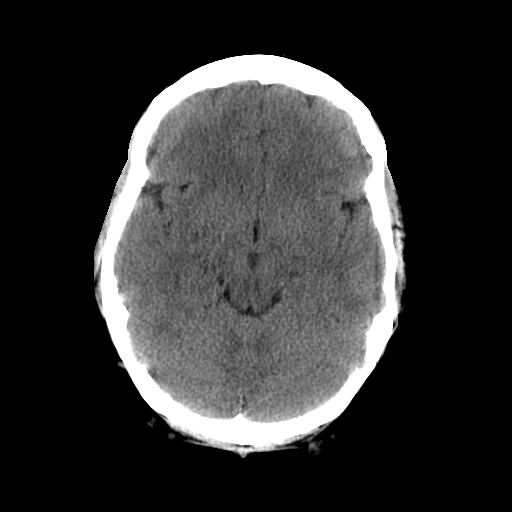
[im 12/32  brain]
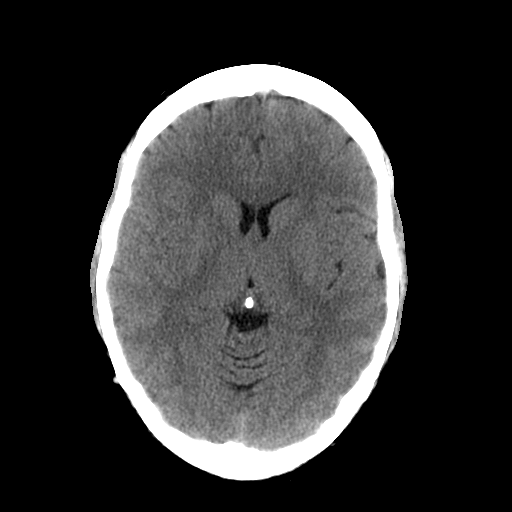
[im 12/32  bone]
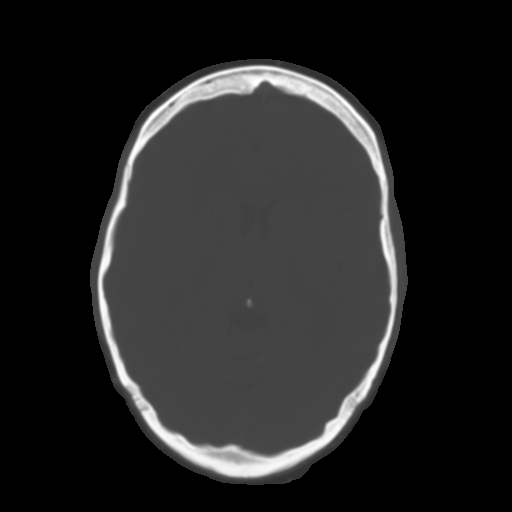
[im 14/32  brain]
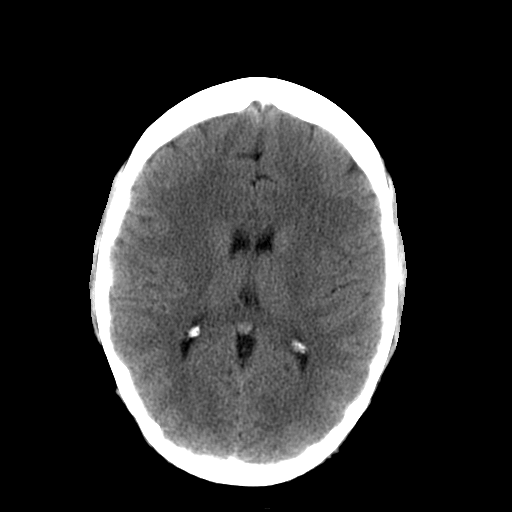
[im 16/32  brain]
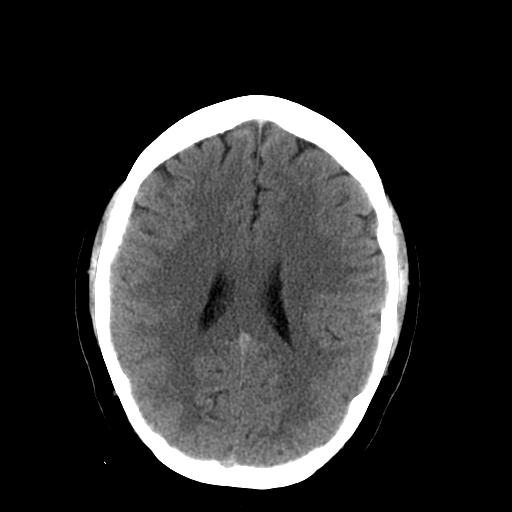
[im 18/32  brain]
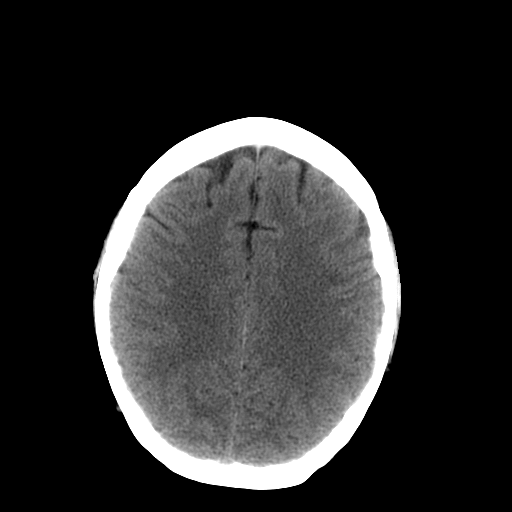
[im 20/32  brain]
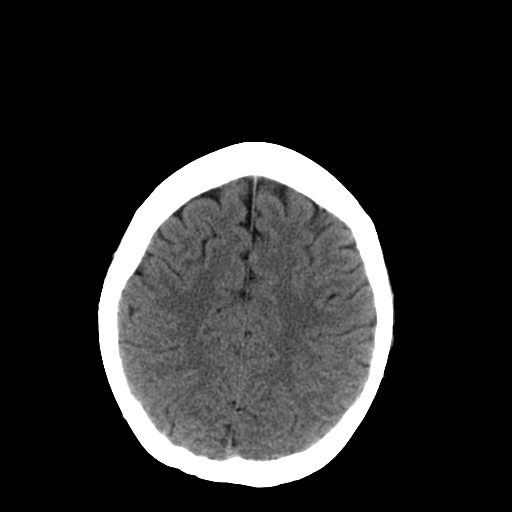
[im 20/32  bone]
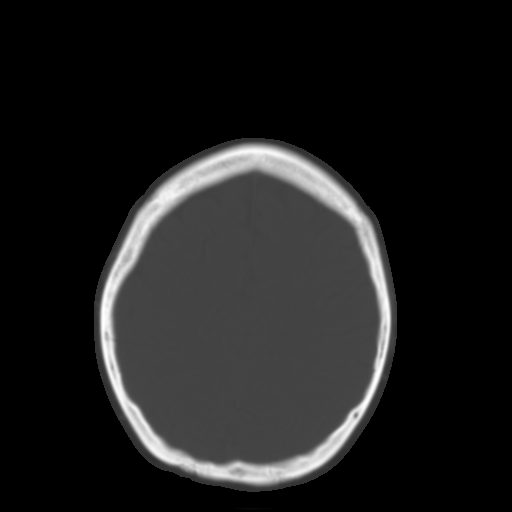
[im 23/32  brain]
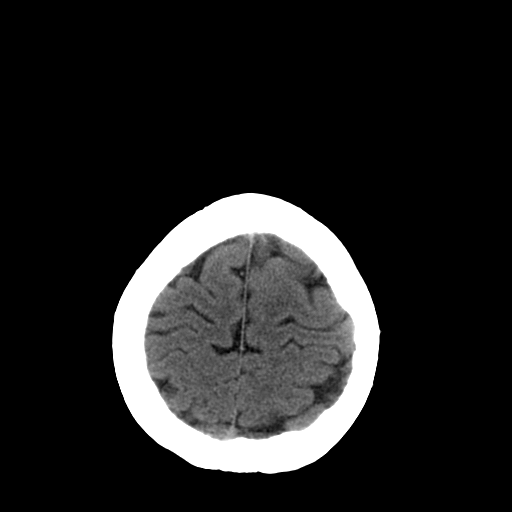
[im 25/32  brain]
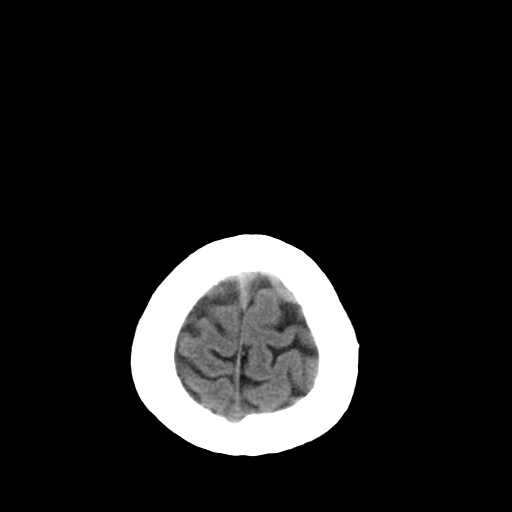
[im 27/32  brain]
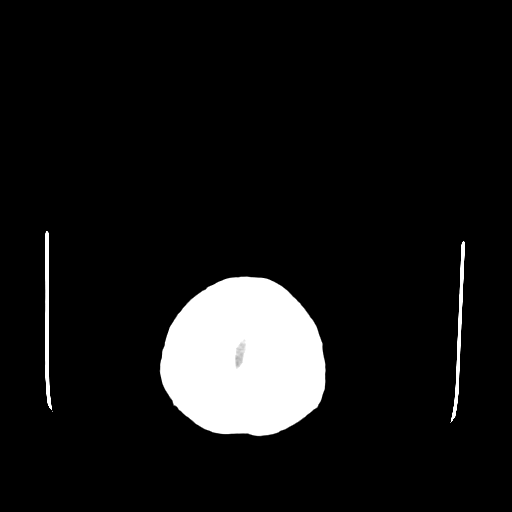
[im 29/32  brain]
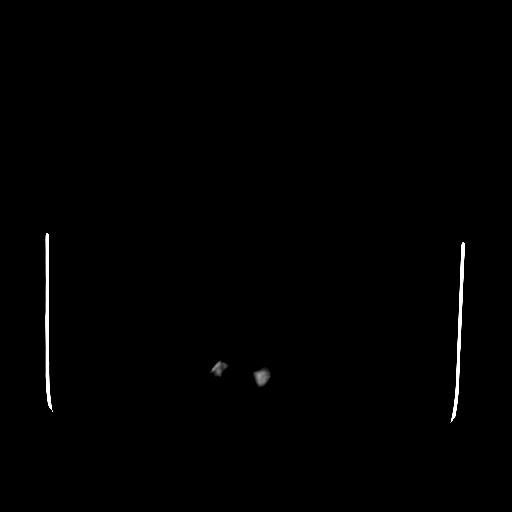
[im 29/32  bone]
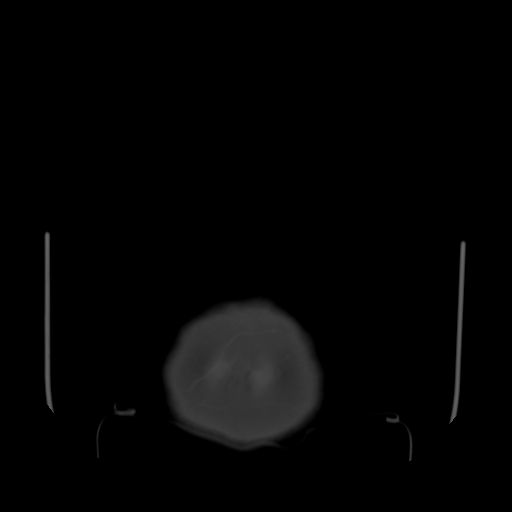

[13 of 30 positions shown; findings below may reference images not displayed]

FINDINGS: The ventricles are normal.  No extra-axial fluid
collections are seen.  The brainstem and cerebellum are
unremarkable.  No acute intracranial findings such as infarction or
hemorrhage.  No mass lesions.

The bony calvarium is intact.  The visualized paranasal sinuses and
mastoid air cells are clear.
IMPRESSION: No acute intracranial findings or mass lesions.

## 2011-06-30 DIAGNOSIS — E0500 Thyrotoxicosis with diffuse goiter without thyrotoxic crisis or storm: Secondary | ICD-10-CM | POA: Insufficient documentation

## 2011-06-30 HISTORY — DX: Thyrotoxicosis with diffuse goiter without thyrotoxic crisis or storm: E05.00

## 2012-11-14 DIAGNOSIS — G4733 Obstructive sleep apnea (adult) (pediatric): Secondary | ICD-10-CM | POA: Insufficient documentation

## 2013-01-04 ENCOUNTER — Encounter: Payer: Self-pay | Admitting: Podiatry

## 2013-01-04 ENCOUNTER — Ambulatory Visit (INDEPENDENT_AMBULATORY_CARE_PROVIDER_SITE_OTHER): Payer: Managed Care, Other (non HMO) | Admitting: Podiatry

## 2013-01-04 VITALS — BP 146/81 | HR 56 | Resp 20

## 2013-01-04 DIAGNOSIS — B351 Tinea unguium: Secondary | ICD-10-CM

## 2013-01-04 NOTE — Progress Notes (Signed)
Subjective:     Patient ID: Terri Hunt, female   DOB: 01/27/1976, 37 y.o.   MRN: [id number]  HPI patient states my nails are improved and I'm here for laser   Review of Systems     Objective:   Physical Exam    neurovascular status intact with significant improvement nailbeds 1-5 both feet Assessment:     Improve mycotic infection nailbeds 1-5 both feet    Plan:     Laser application approximately 1500 pulses applied today and continue topical medicine

## 2013-01-10 ENCOUNTER — Ambulatory Visit (INDEPENDENT_AMBULATORY_CARE_PROVIDER_SITE_OTHER): Payer: Managed Care, Other (non HMO) | Admitting: Gynecology

## 2013-01-10 ENCOUNTER — Encounter: Payer: Self-pay | Admitting: Gynecology

## 2013-01-10 ENCOUNTER — Other Ambulatory Visit: Payer: Self-pay | Admitting: Gynecology

## 2013-01-10 VITALS — BP 138/80 | Ht 62.0 in | Wt 360.0 lb

## 2013-01-10 DIAGNOSIS — N938 Other specified abnormal uterine and vaginal bleeding: Secondary | ICD-10-CM

## 2013-01-10 DIAGNOSIS — N949 Unspecified condition associated with female genital organs and menstrual cycle: Secondary | ICD-10-CM

## 2013-01-10 DIAGNOSIS — N946 Dysmenorrhea, unspecified: Secondary | ICD-10-CM | POA: Insufficient documentation

## 2013-01-10 LAB — CBC WITH DIFFERENTIAL/PLATELET
Basophils Relative: 0 % (ref 0–1)
Hemoglobin: 13.8 g/dL (ref 12.0–15.0)
Lymphocytes Relative: 40 % (ref 12–46)
MCHC: 32.2 g/dL (ref 30.0–36.0)
Monocytes Relative: 6 % (ref 3–12)
Neutro Abs: 4.9 10*3/uL (ref 1.7–7.7)
Neutrophils Relative %: 53 % (ref 43–77)
RBC: 5.08 MIL/uL (ref 3.87–5.11)
WBC: 9.2 10*3/uL (ref 4.0–10.5)

## 2013-01-10 LAB — PREGNANCY, URINE: Preg Test, Ur: NEGATIVE

## 2013-01-10 MED ORDER — MEGESTROL ACETATE 40 MG PO TABS: 40.0000 mg | ORAL_TABLET | Freq: Two times a day (BID) | ORAL | Status: DC

## 2013-01-10 NOTE — Addendum Note (Signed)
Addended by: Bertram Savin A on: 01/10/2013 04:07 PM   Modules accepted: Orders

## 2013-01-10 NOTE — Patient Instructions (Addendum)
Transvaginal Ultrasound Transvaginal ultrasound is a pelvic ultrasound, using a metal probe that is placed in the vagina, to look at a women's female organs. Transvaginal ultrasound is a method of seeing inside the pelvis of a woman. The ultrasound machine sends out sound waves from the transducer (probe). These sound waves bounce off body structures (like an echo) to create a picture. The picture shows up on a monitor. It is called transvaginal because the probe is inserted into the vagina. There should be very little discomfort from the vaginal probe. This test can also be used during pregnancy. Endovaginal ultrasound is another name for a transvaginal ultrasound. In a transabdominal ultrasound, the probe is placed on the outside of the belly. This method gives pictures that are lower quality than pictures from the transvaginal technique. Transvaginal ultrasound is used to look for problems of the female genital tract. Some such problems include:  Infertility problems.  Congenital (birth defect) malformations of the uterus and ovaries.  Tumors in the uterus.  Abnormal bleeding.  Ovarian tumors and cysts.  Abscess (inflamed tissue around pus) in the pelvis.  Unexplained abdominal or pelvic pain.  Pelvic infection. DURING PREGNANCY, TRANSVAGINAL ULTRASOUND MAY BE USED TO LOOK AT:  Normal pregnancy.  Ectopic pregnancy (pregnancy outside the uterus).  Fetal heartbeat.  Abnormalities in the pelvis, that are not seen well with transabdominal ultrasound.  Suspected twins or multiples.  Impending miscarriage.  Problems with the cervix (incompetent cervix, not able to stay closed and hold the baby).  When doing an amniocentesis (removing fluid from the pregnancy sac, for testing).  Looking for abnormalities of the baby.  Checking the growth, development, and age of the fetus.  Measuring the amount of fluid in the amniotic sac.  When doing an external version of the baby (moving  baby into correct position).  Evaluating the baby for problems in high risk pregnancies (biophysical profile).  Suspected fetal demise (death). Sometimes a special ultrasound method called Saline Infusion Sonography (SIS) is used for a more accurate look at the uterus. Sterile saline (salt water) is injected into the uterus of non-pregnant patients to see the inside of the uterus better. SIS is not used on pregnant women. The vaginal probe can also assist in obtaining biopsies of abnormal areas, in draining fluid from cysts on the ovary, and in finding IUDs (intrauterine device, birth control) that cannot be located. PREPARATION FOR TEST A transvaginal ultrasound is done with the bladder empty. The transabdominal ultrasound is done with your bladder full. You may be asked to drink several glasses of water before that exam. Sometimes, a transabdominal ultrasound is done just after a transvaginal ultrasound, to look at organs in your abdomen. PROCEDURE  You will lie down on a table, with your knees bent and your feet in foot holders. The probe is covered with a condom. A sterile lubricant is put into the vagina and on the probe. The lubricant helps transmit the sound waves and avoid irritating the vagina. Your caregiver will move the probe inside the vaginal cavity to scan the pelvic structures. A normal test will show a normal pelvis and normal contents. An abnormal test will show abnormalities of the pelvis, placenta, or baby. ABNORMAL RESULTS MAY BE DUE TO:  Growths or tumors in the:  Uterus.  Ovaries.  Vagina.  Other pelvic structures.  Non-cancerous growths of the uterus and ovaries.  Twisting of the ovary, cutting off blood supply to the ovary (ovarian torsion).  Areas of infection, including:  Pelvic  inflammatory disease.  Abscess in the pelvis.  Locating an IUD. PROBLEMS FOUND IN PREGNANT WOMEN MAY INCLUDE:  Ectopic pregnancy (pregnancy outside the uterus).  Multiple  pregnancies.  Early dilation (opening) of the cervix. This may indicate an incompetent cervix and early delivery.  Impending miscarriage.  Fetal death.  Problems with the placenta, including:  Placenta has grown over the opening of the womb (placenta previa).  Placenta has separated early in the womb (placental abruption).  Placenta grows into the muscle of the uterus (placenta accreta).  Tumors of pregnancy, including gestational trophoblastic disease. This is an abnormal pregnancy, with no fetus. The uterus is filled with many grape-like cysts that could sometimes be cancerous.  Incorrect position of the fetus (breech, vertex).  Intrauterine fetal growth retardation (IUGR) (poor growth in the womb).  Fetal abnormalities or infection. RISKS AND COMPLICATIONS There are no known risks to the ultrasound procedure. There is no X-ray used when doing an ultrasound. Document Released: 01/01/2004 Document Revised: 04/13/2011 Document Reviewed: 12/19/2008 [facility] Patient Information 2014 [place], [place].  Megestrol tablets What is this medicine? MEGESTROL (me JES trol) belongs to a class of drugs known as progestins. Megestrol tablets are used to treat advanced breast or endometrial cancer. This medicine may be used for other purposes; ask your health care provider or pharmacist if you have questions. COMMON BRAND NAME(S): Megace What should I tell my health care provider before I take this medicine? They need to know if you have any of these conditions: -adrenal gland problems -history of blood clots of the legs, lungs, or other parts of the body -diabetes -kidney disease -liver disease -stroke -an unusual or allergic reaction to megestrol, other medicines, foods, dyes, or preservatives -pregnant or trying to get pregnant -breast-feeding How should I use this medicine? Take this medicine by mouth. Follow the directions on the prescription label. Do not take your medicine more  often than directed. Take your doses at regular intervals. Do not stop taking except on the advice of your doctor or health care professional. Talk to your pediatrician regarding the use of this medicine in children. Special care may be needed. Overdosage: If you think you have taken too much of this medicine contact a poison control center or emergency room at once. NOTE: This medicine is only for you. Do not share this medicine with others. What if I miss a dose? If you miss a dose, take it as soon as you can. If it is almost time for your next dose, take only that dose. Do not take double or extra doses. What may interact with this medicine? Do not take this medicine with any of the following medications: -dofetilide This medicine may also interact with the following medications: -carbamazepine -indinavir -phenobarbital -phenytoin -primidone -rifampin -warfarin This list may not describe all possible interactions. Give your health care provider a list of all the medicines, herbs, non-prescription drugs, or dietary supplements you use. Also tell them if you smoke, drink alcohol, or use illegal drugs. Some items may interact with your medicine. What should I watch for while using this medicine? Visit your doctor or health care professional for regular checks on your progress. Continue taking this medicine even if you feel better. It may take 2 months of regular use before you know if this medicine is working for your condition. If you are a female of child-bearing age, use an effective method of birth control while you are taking this medicine. This medicine should not be used by females who  are pregnant or breast-feeding. There is a potential for serious side effects to an unborn child or to an infant. Talk to your health care professional or pharmacist for more information. If you have diabetes, this medicine may affect blood sugar levels. Check your blood sugar and talk to your doctor or  health care professional if you notice changes. What side effects may I notice from receiving this medicine? Side effects that you should report to your doctor or health care professional as soon as possible: -difficulty breathing or shortness of breath -chest pain -dizziness -fluid retention -increased blood pressure -leg pain or swelling -nausea and vomiting -skin rash or itching -weakness Side effects that usually do not require medical attention (report to your doctor or health care professional if they continue or are bothersome): -breakthrough menstrual bleeding -hot flashes or flushing -increased appetite -mood changes -sweating -weight gain This list may not describe all possible side effects. Call your doctor for medical advice about side effects. You may report side effects to FDA at 1-800-FDA-1088. Where should I keep my medicine? Keep out of the reach of children. Store at controlled room temperature between 15 and 30 degrees C (59 and 86 degrees F). Protect from heat above 40 degrees C (104 degrees F). Throw away any unused medicine after the expiration date. NOTE: This sheet is a summary. It may not cover all possible information. If you have questions about this medicine, talk to your doctor, pharmacist, or health care provider.  2014, [employer]. (2007-08-08 15:57:10)

## 2013-01-10 NOTE — Progress Notes (Signed)
Patient is a 37 year old who has not been seen in the office in several years. Patient presented to the office complaining of bleeding continuously on and off since November 20. Patient states that she is sexually active and has been using condoms for contraception. Patient is currently part of a program at [facility]   to help her lose weight whereby she is on a liquid diet consisting of 970 calories per day when she started in September 2014. The progress called Optifast and she has lost 67 pounds.  Exam: Pelvic exam limited due to the patient's obesity. Speculum exam with patient's legs highly flexed in a high position to be able to visualize the cervix. There was blood in the vaginal vault. The vagina was cleansed of blood and clots. The cervix was cleansed with Betadine solution. A single-tooth tenaculum was placed on the anterior cervical lip. The uterus sounded to approximately 10 cm. An endometrial biopsy was obtained using a sterile Pipelle and the tissue was submitted for histological evaluation.  Assessment/plan: Patient with dysfunctional uterine bleeding since 12/22/2012. A CBC will be drawn today to rule out anemia or platelet dysfunction. Were also check her TSH and prolactin to make sure that there is no hormonal imbalance contributing to this. Endometrial biopsy was done to rule out endometrial hyperplasia or malignancy since patient is nulliparous and morbidly obese. She will be started on Megace 40 mg twice a day for 10 days with 1 refills as we schedule her for a sonohysterogram to rule out any intrauterine polyp or myoma. Prior to the endometrial biopsy a urine pregnancy test was done in the office which was negative. She was right use condoms during time of intercourse while she is on Megace. We will call her at the other week for the results of the pathology as well as her blood work.

## 2013-01-11 ENCOUNTER — Encounter: Payer: Self-pay | Admitting: Gynecology

## 2013-01-11 LAB — TSH: TSH: 5.563 u[IU]/mL — ABNORMAL HIGH (ref 0.350–4.500)

## 2013-01-11 LAB — THYROID PROFILE - CHCC: Free Thyroxine Index: 3 (ref 1.0–3.9)

## 2013-01-11 LAB — PROLACTIN: Prolactin: 20.8 ng/mL

## 2013-01-17 ENCOUNTER — Encounter: Payer: Self-pay | Admitting: Gynecology

## 2013-01-19 ENCOUNTER — Other Ambulatory Visit: Payer: Self-pay | Admitting: Gynecology

## 2013-01-19 DIAGNOSIS — N938 Other specified abnormal uterine and vaginal bleeding: Secondary | ICD-10-CM

## 2013-02-02 DIAGNOSIS — N840 Polyp of corpus uteri: Secondary | ICD-10-CM

## 2013-02-02 HISTORY — PX: OTHER SURGICAL HISTORY: SHX169

## 2013-02-02 HISTORY — DX: Polyp of corpus uteri: N84.0

## 2013-02-07 ENCOUNTER — Encounter: Payer: Self-pay | Admitting: Gynecology

## 2013-02-08 ENCOUNTER — Other Ambulatory Visit: Payer: Self-pay | Admitting: Gynecology

## 2013-02-08 ENCOUNTER — Ambulatory Visit (INDEPENDENT_AMBULATORY_CARE_PROVIDER_SITE_OTHER): Payer: Managed Care, Other (non HMO) | Admitting: Gynecology

## 2013-02-08 ENCOUNTER — Ambulatory Visit (INDEPENDENT_AMBULATORY_CARE_PROVIDER_SITE_OTHER): Payer: Managed Care, Other (non HMO)

## 2013-02-08 DIAGNOSIS — N840 Polyp of corpus uteri: Secondary | ICD-10-CM

## 2013-02-08 DIAGNOSIS — D252 Subserosal leiomyoma of uterus: Secondary | ICD-10-CM

## 2013-02-08 DIAGNOSIS — N938 Other specified abnormal uterine and vaginal bleeding: Secondary | ICD-10-CM

## 2013-02-08 DIAGNOSIS — N83209 Unspecified ovarian cyst, unspecified side: Secondary | ICD-10-CM

## 2013-02-08 DIAGNOSIS — E039 Hypothyroidism, unspecified: Secondary | ICD-10-CM

## 2013-02-08 DIAGNOSIS — N852 Hypertrophy of uterus: Secondary | ICD-10-CM

## 2013-02-08 DIAGNOSIS — N841 Polyp of cervix uteri: Secondary | ICD-10-CM

## 2013-02-08 DIAGNOSIS — N839 Noninflammatory disorder of ovary, fallopian tube and broad ligament, unspecified: Secondary | ICD-10-CM

## 2013-02-08 DIAGNOSIS — N925 Other specified irregular menstruation: Secondary | ICD-10-CM

## 2013-02-08 DIAGNOSIS — D251 Intramural leiomyoma of uterus: Secondary | ICD-10-CM

## 2013-02-08 DIAGNOSIS — N949 Unspecified condition associated with female genital organs and menstrual cycle: Secondary | ICD-10-CM

## 2013-02-08 DIAGNOSIS — D259 Leiomyoma of uterus, unspecified: Secondary | ICD-10-CM

## 2013-02-08 DIAGNOSIS — N831 Corpus luteum cyst of ovary, unspecified side: Secondary | ICD-10-CM

## 2013-02-08 NOTE — Patient Instructions (Signed)
Hysteroscopy Hysteroscopy is a procedure used for looking inside the womb (uterus). It may be done for many different reasons, including:  To evaluate abnormal bleeding, fibroid (benign, noncancerous) tumors, polyps, scar tissue (adhesions), and possibly cancer of the uterus.  To look for lumps (tumors) and other uterine growths.  To look for causes of why a woman cannot get pregnant (infertility), causes of recurrent loss of pregnancy (miscarriages), or a lost intrauterine device (IUD).  To perform a sterilization by blocking the fallopian tubes from inside the uterus. A hysteroscopy should be done right after a menstrual period to be sure you are not pregnant. LET YOUR CAREGIVER KNOW ABOUT:   Allergies.  Medicines taken, including herbs, eyedrops, over-the-counter medicines, and creams.  Use of steroids (by mouth or creams).  Previous problems with anesthetics or numbing medicines.  History of bleeding or blood problems.  History of blood clots.  Possibility of pregnancy, if this applies.  Previous surgery.  Other health problems. RISKS AND COMPLICATIONS   Putting a hole in the uterus.  Excessive bleeding.  Infection.  Damage to the cervix.  Injury to other organs.  Allergic reaction to medicines.  Too much fluid used in the uterus for the procedure. BEFORE THE PROCEDURE   Do not take aspirin or blood thinners for a week before the procedure, or as directed. It can cause bleeding.  Arrive at least 60 minutes before the procedure or as directed to read and sign the necessary forms.  Arrange for someone to take you home after the procedure.  If you smoke, do not smoke for 2 weeks before the procedure. PROCEDURE   Your caregiver may give you medicine to relax you. He or she may also give you a medicine that numbs the area around the cervix (local anesthetic) or a medicine that makes you sleep (general anesthesia).  Sometimes, a medicine is placed in the  cervix the day before the procedure. This medicine makes the cervix have a larger opening (dilate). This makes it easier for the instrument to be inserted into the uterus.  A small instrument (hysteroscope) is inserted through the vagina into the uterus. This instrument is similar to a pencil-sized telescope with a light.  During the procedure, air or a liquid is put into the uterus, which allows the surgeon to see better.  Sometimes, tissue is gently scraped from inside the uterus. These tissue samples are sent to a specialist who looks at tissue samples (pathologist). The pathologist will give a report to your caregiver. This will help your caregiver decide if further treatment is necessary. The report will also help your caregiver decide on the best treatment if the test comes back abnormal. AFTER THE PROCEDURE   If you had a general anesthetic, you may be groggy for a couple hours after the procedure.  If you had a local anesthetic, you will be advised to rest at the surgical center or caregiver's office until you are stable and feel ready to go home.  You may have some cramping for a couple days.  You may have bleeding, which varies from light spotting for a few days to menstrual-like bleeding for up to 3 to 7 days. This is normal.  Have someone take you home. FINDING OUT THE RESULTS OF YOUR TEST Not all test results are available during your visit. If your test results are not back during the visit, make an appointment with your caregiver to find out the results. Do not assume everything is normal if you   have not heard from your caregiver or the medical facility. It is important for you to follow up on all of your test results. HOME CARE INSTRUCTIONS   Do not drive for 24 hours or as instructed.  Only take over-the-counter or prescription medicines for pain, discomfort, or fever as directed by your caregiver.  Do not take aspirin. It can cause or aggravate bleeding.  Do not drive or  drink alcohol while taking pain medicine.  You may resume your usual diet.  Do not use tampons, douche, or have sexual intercourse for 2 weeks, or as advised by your caregiver.  Rest and sleep for the first 24 to 48 hours.  Take your temperature twice a day for 4 to 5 days. Write it down. Give these temperatures to your caregiver if they are abnormal (above 98.6 F or 37.0 C).  Take medicines your caregiver has ordered as directed.  Follow your caregiver's advice regarding diet, exercise, lifting, driving, and general activities.  Take showers instead of baths for 2 weeks, or as recommended by your caregiver.  If you develop constipation:  Take a mild laxative with the advice of your caregiver.  Eat bran foods.  Drink enough water and fluids to keep your urine clear or pale yellow.  Try to have someone with you or available to you for the first 24 to 48 hours, especially if you had a general anesthetic.  Make sure you and your family understand everything about your operation and recovery.  Follow your caregiver's advice regarding follow-up appointments and Pap smears. SEEK MEDICAL CARE IF:   You feel dizzy or lightheaded.  You feel sick to your stomach (nauseous).  You develop abnormal vaginal discharge.  You develop a rash.  You have an abnormal reaction or allergy to your medicine.  You need stronger pain medicine. SEEK IMMEDIATE MEDICAL CARE IF:   Bleeding is heavier than a normal menstrual period or you have blood clots.  You have an oral temperature above 102 F (38.9 C), not controlled by medicine.  You have increasing cramps or pains not relieved with medicine.  You develop belly (abdominal) pain that does not seem to be related to the same area of earlier cramping and pain.  You pass out.  You develop pain in the tops of your shoulders (shoulder strap areas).  You develop shortness of breath. MAKE SURE YOU:   Understand these instructions.  Will  watch your condition.  Will get help right away if you are not doing well or get worse. Document Released: 04/27/2000 Document Revised: 04/13/2011 Document Reviewed: 08/18/2012 [employer] Patient Information 2014 [employer].  

## 2013-02-08 NOTE — Progress Notes (Signed)
   Patient is a 38-year-old who presented to the office today for sonohysterogram as part of her evaluation for dysfunctional uterine bleeding. She was seen in the office on December 9 and had been complaining of continuous vaginal bleeding since 12/22/2012.Patient states that she is sexually active and has been using condoms for contraception. Patient is currently part of a program at [facility] to help her lose weight whereby she is on a liquid diet consisting of 970 calories per day when she started in September 2014. The progress called Optifast and she has lost 92 pounds.  On that office visit an endometrial biopsy was done which demonstrated the following:  Diagnosis Endometrium, biopsy - INTERVAL PHASE ENDOMETRIUM (LATE PROLIFERATIVE / EARLY SECRETORY). - THERE IS NO EVIDENCE OF HYPERPLASIA OR MALIGNANCY  Blood work that was done in that office visit demonstrated a normal CBC, normal prolactin but her TSH was found to be elevated at 5.56 with a normal T4 and T3.  Her sonohysterogram today as follows: Uterus measuring 10.6 x 8.7 x 6.8 cm with endometrial stripe of 9.3 mm. Patient was found to have 7 fibroids the largest measuring 4.3 x 3.8 cm right ovary had a thin wall echo free a vascular cyst measuring 5.0 x 5.0 x 4.2 average size 4.7 cm. Left ovarian cyst interval low-level echoes measuring 16 x 15 mm positive color flow in the periphery suspicious as corpus luteum cyst. No fluid in the cul-de-sac. Normal saline was instilled in a sterile fashion into the uterine cavity and a right anterior uterine defect measured 10 x 9 x 9 mm was noted  Assessment/plan: #1 cervical polyp noted on the external also which was twisted off its pedicle with a ring forcep and submitted for histological evaluation #2 morbidly obese patient with dysfunctional uterine bleeding sonohysterogram demonstrated endometrial polyp. Patient will be scheduled for resectoscopic polypectomy.  Patient will be prescribed Megace 40 mg twice a day for the next 2-3 weeks. #3 elevated TSH (hypothyroidism) we'll repeat thyroid function test today. #4. When patient returns for preop visit we will do her Pap smear. 

## 2013-02-09 ENCOUNTER — Telehealth: Payer: Self-pay

## 2013-02-09 LAB — THYROID PANEL WITH TSH
FREE THYROXINE INDEX: 4.5 — AB (ref 1.0–3.9)
T3 UPTAKE: 38.7 % — AB (ref 22.5–37.0)
T4 TOTAL: 11.5 ug/dL (ref 5.0–12.5)
TSH: 3.018 u[IU]/mL (ref 0.350–4.500)

## 2013-02-09 NOTE — Telephone Encounter (Signed)
Patient informed surgery scheduled for Fri 02/24/13 7:30am at [facility].  Pre op consult 02/14/13 9:00am. Patient will wait to hear from [facility].

## 2013-02-10 ENCOUNTER — Encounter: Payer: Managed Care, Other (non HMO) | Admitting: Gynecology

## 2013-02-13 ENCOUNTER — Encounter (HOSPITAL_COMMUNITY): Payer: Self-pay | Admitting: Pharmacist

## 2013-02-14 ENCOUNTER — Telehealth: Payer: Self-pay | Admitting: Diagnostic Neuroimaging

## 2013-02-14 ENCOUNTER — Telehealth: Payer: Self-pay | Admitting: *Deleted

## 2013-02-14 ENCOUNTER — Other Ambulatory Visit (HOSPITAL_COMMUNITY)
Admission: RE | Admit: 2013-02-14 | Discharge: 2013-02-14 | Disposition: A | Discharge status: Home / Self Care | Payer: Managed Care, Other (non HMO) | Source: Ambulatory Visit | Attending: Gynecology | Admitting: Gynecology

## 2013-02-14 ENCOUNTER — Ambulatory Visit (INDEPENDENT_AMBULATORY_CARE_PROVIDER_SITE_OTHER): Payer: Managed Care, Other (non HMO) | Admitting: Gynecology

## 2013-02-14 ENCOUNTER — Encounter: Payer: Self-pay | Admitting: Gynecology

## 2013-02-14 VITALS — BP 132/84 | Ht 62.0 in | Wt 329.0 lb

## 2013-02-14 DIAGNOSIS — N949 Unspecified condition associated with female genital organs and menstrual cycle: Secondary | ICD-10-CM

## 2013-02-14 DIAGNOSIS — N840 Polyp of corpus uteri: Secondary | ICD-10-CM

## 2013-02-14 DIAGNOSIS — Z1151 Encounter for screening for human papillomavirus (HPV): Secondary | ICD-10-CM | POA: Insufficient documentation

## 2013-02-14 DIAGNOSIS — Z124 Encounter for screening for malignant neoplasm of cervix: Secondary | ICD-10-CM | POA: Insufficient documentation

## 2013-02-14 DIAGNOSIS — J329 Chronic sinusitis, unspecified: Secondary | ICD-10-CM

## 2013-02-14 DIAGNOSIS — N938 Other specified abnormal uterine and vaginal bleeding: Secondary | ICD-10-CM

## 2013-02-14 DIAGNOSIS — Z01818 Encounter for other preprocedural examination: Secondary | ICD-10-CM

## 2013-02-14 DIAGNOSIS — Z01419 Encounter for gynecological examination (general) (routine) without abnormal findings: Secondary | ICD-10-CM

## 2013-02-14 MED ORDER — CEFUROXIME AXETIL 500 MG PO TABS: 500.0000 mg | ORAL_TABLET | Freq: Two times a day (BID) | ORAL | Status: DC

## 2013-02-14 NOTE — Progress Notes (Signed)
Terri Hunt is an 38 y.o. female. Who is scheduled for resectoscopic polypectomy next week and is here for preoperative consultation. Patient had been complaining of frontal sinus congestion and postnasal drip but no fever. Patient had brought to my attention going over her medical history that in 2011 she had 2 seizure episodes one month apart and for this reason has been on Keppra and has been followed by her neurologist which she lasts all last summer  (Dr. Bess Harvest ). Patient reports no recurrent episodes of seizures since that time.  Patient had been seen in the office on December 9 and had been complaining of continuous vaginal bleeding since 12/22/2012.Patient states that she is sexually active and has been using condoms for contraception. Patient is currently part of a program at [facility] to help her lose weight whereby she is on a liquid diet consisting of 970 calories per day when she started in September 2014. The progress called Optifast and she has lost 92 pounds.   Patient had been complaining of dysfunctional uterine bleeding and eventually had an endometrial biopsy which demonstrated the following:  Diagnosis  Endometrium, biopsy  - INTERVAL PHASE ENDOMETRIUM (LATE PROLIFERATIVE / EARLY SECRETORY).  - THERE IS NO EVIDENCE OF HYPERPLASIA OR MALIGNANCY  Blood work that was done in that office visit demonstrated a normal CBC, normal prolactin but her TSH was found to be elevated at 5.56 with a normal T4 and T3.  Her sonohysterogram today as follows:  Uterus measuring 10.6 x 8.7 x 6.8 cm with endometrial stripe of 9.3 mm. Patient was found to have 7 fibroids the largest measuring 4.3 x 3.8 cm right ovary had a thin wall echo free a vascular cyst measuring 5.0 x 5.0 x 4.2 average size 4.7 cm. Left ovarian cyst interval low-level echoes measuring 16 x 15 mm positive color flow in the periphery suspicious as corpus luteum cyst. No fluid in the  cul-de-sac. Normal saline was instilled in a sterile fashion into the uterine cavity and a right anterior uterine defect measured 10 x 9 x 9 mm was noted. A benign cervical polyp was removed in the office at time of the sonohysterogram which pathology report stated that it was benign.   Pertinent Gynecological History: Menses: heavy periods Bleeding: heavy and long Contraception: condoms DES exposure: denies Blood transfusions: none Sexually transmitted diseases: no past history Previous GYN Procedures: no  Last mammogram: no prior study Date: no prior study  Last pap: normal Date 2011 OB History: G0, P0   Menstrual History: Menarche age: 61  Patient's last menstrual period was 02/01/2013. Period Cycle (Days): 30 Period Duration (Days): 5-7 Period Pattern: Regular Menstrual Flow: Heavy;Moderate Menstrual Control: Maxi pad Dysmenorrhea: Moderate Dysmenorrhea Symptoms: Cramping;Headache  Past Medical History  Diagnosis Date  . Diabetes mellitus without complication   . GERD (gastroesophageal reflux disease)   . Allergy   . Seizures     Past Surgical History  Procedure Laterality Date  . Cholecystectomy    . Hand surgery    . Wisdom tooth extraction    . Esophagogastroduodenoscopy endoscopy      Family History  Problem Relation Age of Onset  . Diabetes Mother   . Cancer Father     SKIN  . Hypertension Father   . Cancer Paternal Uncle     SKIN  . Cancer Maternal Grandfather   . Heart disease Paternal Grandfather     Social History:  reports that she quit smoking  about 10 years ago. She has never used smokeless tobacco. She reports that she does not drink alcohol. Her drug history is not on file.  Allergies:  Allergies  Allergen Reactions  . Advil [Ibuprofen] Shortness Of Breath    Can tolerate Alleve OK.     (Not in a hospital admission)  REVIEW OF SYSTEMS: A ROS was performed and pertinent positives and negatives are included in the history.  GENERAL:  No fevers or chills. HEENT: Tender frontal and maxillary sinuses CARDIOVASCULAR: No chest pain or pressure. No palpitations. PULMONARY: No shortness of breath, cough or wheeze. GASTROINTESTINAL: No abdominal pain, nausea, vomiting or diarrhea, melena or bright red blood per rectum. GENITOURINARY: No urinary frequency, urgency, hesitancy or dysuria. MUSCULOSKELETAL: No joint or muscle pain, no back pain, no recent trauma. DERMATOLOGIC: No rash, no itching, no lesions. ENDOCRINE: No polyuria, polydipsia, no heat or cold intolerance. No recent change in weight. HEMATOLOGICAL: No anemia or easy bruising or bleeding. NEUROLOGIC: No headache, seizures, numbness, tingling or weakness. PSYCHIATRIC: No depression, no loss of interest in normal activity or change in sleep pattern.     Blood pressure 132/84, height 5\' 2"  (1.575 m), weight 329 lb (149.233 kg), last menstrual period 02/01/2013.  Physical Exam:  HEENT: Tender frontal and maxillary sinuses Neck:Supple, midline, no thyroid megaly, no carotid bruits Lungs:  Clear to auscultation no rhonchi's or wheezes Heart:Regular rate and rhythm, no murmurs or gallops Breast Exam: No palpable masses or tenderness no supraclavicular axillary lymphadenopathy Abdomen: Pendulous Pelvic:BUS no lesions or discharge Vagina: No lesions or discharge Cervix: No lesions or discharge Uterus: Limited due to abdominal girth see ultrasound Adnexa: Limited due to abdominal girth see  ultrasound Extremities: No cords, no edema Rectal: Not done  Assessment/Plan: Morbidly obese patient with dysfunctional uterine bleeding. Workup had demonstrated on sonohysterogram and endometrial polyp. Patient had preop benign endometrial biopsy. Pap smear was done today. Patient was noted to have sinusitis of the frontal and maxillary sinuses. She will be placed on Ceftin 500 mg twice a day for 7 days. We will obtain medical clearance from her neurologist as to her past history in 2011 of 2  episodes of seizures for which patient currently on Keppra and has had no recurrence. The risk of reoperation were discussed with the patient to include all the following:                        Patient was counseled as to the risk of surgery to include the following:  1. Infection (prohylactic antibiotics will be administered)  2. DVT/Pulmonary Embolism (prophylactic pneumo compression stockings will be used)  3.Trauma to internal organs requiring additional surgical procedure to repair any injury to     Internal organs requiring perhaps additional hospitalization days.  4.Hemmorhage requiring transfusion and blood products which carry risks such as  anaphylactic reaction, hepatitis and AIDS  Patient had received literature information on the procedure scheduled and all her questions were answered and fully accepts all risk.   FERNANDEZ,JUAN HMD10:08 AMTD@Note : This dictation was prepared with  Dragon/digital dictation along withSmart phrase technology. Any transcriptional errors that result from this process are unintentional.      Terrance Mass 02/14/2013, 9:21 AM  Note: This dictation was prepared with  Dragon/digital dictation along withSmart phrase technology. Any transcriptional errors that result from this process are unintentional.

## 2013-02-14 NOTE — Patient Instructions (Signed)
Our office will call you this week for medical clearance from your  neurologist.

## 2013-02-14 NOTE — Telephone Encounter (Signed)
Spoke with Gae Bon at [facility] and this information will be sent to nurse to have this scheduled and nurse will call me back with time and date.

## 2013-02-14 NOTE — Telephone Encounter (Signed)
Message copied by Thamas Jaegers on Tue Feb 14, 2013 10:05 AM ------      Message from: Terrance Mass      Created: Tue Feb 14, 2013  9:57 AM       Terri Hunt, this patient is scheduled for surgery Friday of next week. She was seen today for preop exam and has informed me that she has had seizure history back in 2011 and is on medication and has been followed by her neurologist which she saw last year. Please make an appointment for her before her scheduled surgery for medical clearance. The neurologist name is Dr. Bess Harvest at [facility] [phone]. Thank you ------

## 2013-02-14 NOTE — Telephone Encounter (Signed)
Anderson Malta from [facility] calling to state that patient is having surgery on 02/24/13 and Dr. Toney Rakes is requesting that she get an appointment for medical clearance with Dr. Leta Baptist. Please call Anderson Malta to schedule.

## 2013-02-14 NOTE — Telephone Encounter (Signed)
Called patient to get the date of last seizure and verify tolerating sz med. No answer. Vmail full. Will try later.

## 2013-02-14 NOTE — Telephone Encounter (Signed)
Patient was called to verify if she has had any seizures since 2011 and per the patient she stated no.  Patient was informed to take her seizure medication the day of the surgery per Dr. Leta Baptist.  Anderson Malta from [facility] was called and called and told the above information.  Anderson Malta stated that she did need a letter for surgery clearance, faxed to [phone], Attention:  Anderson Malta.

## 2013-02-15 ENCOUNTER — Encounter: Payer: Self-pay | Admitting: Neurology

## 2013-02-15 NOTE — Telephone Encounter (Signed)
Per [place] office patient is cleared from neurological standpoint okay to proceed with surgery. [place] office faxed letter confirming this information as well. Will give letter to JF

## 2013-02-16 ENCOUNTER — Encounter (HOSPITAL_COMMUNITY)
Admission: RE | Admit: 2013-02-16 | Discharge: 2013-02-16 | Disposition: A | Discharge status: Home / Self Care | Payer: Managed Care, Other (non HMO) | Source: Ambulatory Visit | Attending: Gynecology | Admitting: Gynecology

## 2013-02-16 ENCOUNTER — Encounter (HOSPITAL_COMMUNITY): Payer: Self-pay

## 2013-02-16 DIAGNOSIS — Z01818 Encounter for other preprocedural examination: Secondary | ICD-10-CM | POA: Insufficient documentation

## 2013-02-16 DIAGNOSIS — Z01812 Encounter for preprocedural laboratory examination: Secondary | ICD-10-CM | POA: Insufficient documentation

## 2013-02-16 HISTORY — DX: Other specified health status: Z78.9

## 2013-02-16 HISTORY — DX: Anemia, unspecified: D64.9

## 2013-02-16 HISTORY — DX: Hyperlipidemia, unspecified: E78.5

## 2013-02-16 HISTORY — DX: Sleep apnea, unspecified: G47.30

## 2013-02-16 LAB — BASIC METABOLIC PANEL
BUN: 15 mg/dL (ref 6–23)
CHLORIDE: 101 meq/L (ref 96–112)
CO2: 25 mEq/L (ref 19–32)
Calcium: 9.7 mg/dL (ref 8.4–10.5)
Creatinine, Ser: 0.76 mg/dL (ref 0.50–1.10)
GFR calc non Af Amer: >90 mL/min (ref >90)
Glucose, Bld: 96 mg/dL (ref 70–99)
POTASSIUM: 4.7 meq/L (ref 3.7–5.3)
Sodium: 139 mEq/L (ref 137–147)

## 2013-02-16 LAB — CBC
HCT: 42.2 % (ref 36.0–46.0)
HEMOGLOBIN: 14.1 g/dL (ref 12.0–15.0)
MCH: 28 pg (ref 26.0–34.0)
MCHC: 33.4 g/dL (ref 30.0–36.0)
MCV: 83.9 fL (ref 78.0–100.0)
Platelets: 313 10*3/uL (ref 150–400)
RBC: 5.03 MIL/uL (ref 3.87–5.11)
RDW: 15.6 % — AB (ref 11.5–15.5)
WBC: 8.7 10*3/uL (ref 4.0–10.5)

## 2013-02-16 LAB — URINALYSIS, ROUTINE W REFLEX MICROSCOPIC
BILIRUBIN URINE: NEGATIVE
GLUCOSE, UA: NEGATIVE mg/dL
HGB URINE DIPSTICK: NEGATIVE
Ketones, ur: NEGATIVE mg/dL
Leukocytes, UA: NEGATIVE
Nitrite: NEGATIVE
Protein, ur: NEGATIVE mg/dL
SPECIFIC GRAVITY, URINE: 1.01 (ref 1.005–1.030)
Urobilinogen, UA: 0.2 mg/dL (ref 0.0–1.0)
pH: 5.5 (ref 5.0–8.0)

## 2013-02-16 NOTE — Patient Instructions (Addendum)
   Your procedure is scheduled on: Friday, Jan 23  Enter through the Main Entrance of [facility] at: 6 AM Pick up the phone at the desk and dial [phone] and inform us of your arrival.  Please call this number if you have any problems the morning of surgery: [phone]  Remember: Do not eat or drink after midnight: Thursday: Take these medicines the morning of surgery with a SIP OF WATER:  protonix, keppra, claritin.  Patient instructed to withhold Metformin Friday am, day of surgery dose.  Do not wear jewelry, make-up, or FINGER nail polish No metal in your hair or on your body. Do not wear lotions, powders, perfumes.  You may wear deodorant.   Do not bring valuables to the hospital. Contacts, dentures or bridgework may not be worn into surgery.  Patients discharged on the day of surgery will not be allowed to drive home.  Home with husband Randall Hiss cell [phone].

## 2013-02-16 NOTE — Pre-Procedure Instructions (Signed)
Reviewed patient's medical history, medications and diet program with Dr Jillyn Hidden. No orders given.  Surgical clearance on chart from neurologist

## 2013-02-23 MED ORDER — DEXTROSE 5 % IV SOLN
2.0000 g | INTRAVENOUS | Status: AC
  Administered 2013-02-24: 2 g via INTRAVENOUS
  Filled 2013-02-23: qty 2

## 2013-02-24 ENCOUNTER — Ambulatory Visit (HOSPITAL_COMMUNITY)
Admission: RE | Admit: 2013-02-24 | Discharge: 2013-02-24 | Disposition: A | Discharge status: Home / Self Care | Payer: Managed Care, Other (non HMO) | Source: Ambulatory Visit | Attending: Gynecology | Admitting: Gynecology

## 2013-02-24 ENCOUNTER — Encounter (HOSPITAL_COMMUNITY): Payer: Managed Care, Other (non HMO) | Admitting: Anesthesiology

## 2013-02-24 ENCOUNTER — Ambulatory Visit (HOSPITAL_COMMUNITY): Payer: Managed Care, Other (non HMO) | Admitting: Anesthesiology

## 2013-02-24 ENCOUNTER — Encounter (HOSPITAL_COMMUNITY)
Admission: RE | Disposition: A | Discharge status: Home / Self Care | Payer: Self-pay | Source: Ambulatory Visit | Attending: Gynecology

## 2013-02-24 DIAGNOSIS — N949 Unspecified condition associated with female genital organs and menstrual cycle: Secondary | ICD-10-CM

## 2013-02-24 DIAGNOSIS — N83209 Unspecified ovarian cyst, unspecified side: Secondary | ICD-10-CM | POA: Insufficient documentation

## 2013-02-24 DIAGNOSIS — N840 Polyp of corpus uteri: Secondary | ICD-10-CM

## 2013-02-24 DIAGNOSIS — N938 Other specified abnormal uterine and vaginal bleeding: Secondary | ICD-10-CM | POA: Insufficient documentation

## 2013-02-24 DIAGNOSIS — E039 Hypothyroidism, unspecified: Secondary | ICD-10-CM | POA: Insufficient documentation

## 2013-02-24 DIAGNOSIS — N925 Other specified irregular menstruation: Secondary | ICD-10-CM

## 2013-02-24 DIAGNOSIS — N841 Polyp of cervix uteri: Secondary | ICD-10-CM | POA: Insufficient documentation

## 2013-02-24 DIAGNOSIS — Z9889 Other specified postprocedural states: Secondary | ICD-10-CM

## 2013-02-24 HISTORY — PX: DILATATION & CURETTAGE/HYSTEROSCOPY WITH TRUECLEAR: SHX6353

## 2013-02-24 LAB — PREGNANCY, URINE: Preg Test, Ur: NEGATIVE

## 2013-02-24 LAB — GLUCOSE, CAPILLARY
GLUCOSE-CAPILLARY: 122 mg/dL — AB (ref 70–99)
Glucose-Capillary: 103 mg/dL — ABNORMAL HIGH (ref 70–99)

## 2013-02-24 SURGERY — DILATATION & CURETTAGE/HYSTEROSCOPY WITH TRUCLEAR
Anesthesia: General | Site: Uterus

## 2013-02-24 MED ORDER — FENTANYL CITRATE 0.05 MG/ML IJ SOLN
25.0000 ug | INTRAMUSCULAR | Status: DC | PRN
  Administered 2013-02-24: 50 ug via INTRAVENOUS

## 2013-02-24 MED ORDER — ONDANSETRON HCL 4 MG/2ML IJ SOLN
INTRAMUSCULAR | Status: AC
  Filled 2013-02-24: qty 2

## 2013-02-24 MED ORDER — FENTANYL CITRATE 0.05 MG/ML IJ SOLN
INTRAMUSCULAR | Status: DC | PRN
  Administered 2013-02-24 (×3): 50 ug via INTRAVENOUS

## 2013-02-24 MED ORDER — FENTANYL CITRATE 0.05 MG/ML IJ SOLN
INTRAMUSCULAR | Status: AC
  Filled 2013-02-24: qty 2

## 2013-02-24 MED ORDER — LIDOCAINE HCL (CARDIAC) 20 MG/ML IV SOLN
INTRAVENOUS | Status: DC | PRN
  Administered 2013-02-24: 100 mg via INTRAVENOUS

## 2013-02-24 MED ORDER — ACETAMINOPHEN 160 MG/5ML PO SOLN
975.0000 mg | Freq: Once | ORAL | Status: AC
  Administered 2013-02-24: 975 mg via ORAL

## 2013-02-24 MED ORDER — ONDANSETRON HCL 4 MG/2ML IJ SOLN
INTRAMUSCULAR | Status: DC | PRN
Start: 2013-02-24 — End: 2013-02-24
  Administered 2013-02-24: 4 mg via INTRAVENOUS

## 2013-02-24 MED ORDER — ACETAMINOPHEN 160 MG/5ML PO SOLN
ORAL | Status: AC
  Administered 2013-02-24: 975 mg via ORAL
  Filled 2013-02-24: qty 20.3

## 2013-02-24 MED ORDER — METOCLOPRAMIDE HCL 5 MG/ML IJ SOLN: 10.0000 mg | Freq: Once | INTRAMUSCULAR | Status: DC | PRN

## 2013-02-24 MED ORDER — LACTATED RINGERS IV SOLN
INTRAVENOUS | Status: DC
  Administered 2013-02-24: 06:00:00 via INTRAVENOUS
  Administered 2013-02-24: 50 mL/h via INTRAVENOUS

## 2013-02-24 MED ORDER — MIDAZOLAM HCL 2 MG/2ML IJ SOLN
INTRAMUSCULAR | Status: AC
  Filled 2013-02-24: qty 2

## 2013-02-24 MED ORDER — SODIUM CHLORIDE 0.9 % IR SOLN
Status: DC | PRN
  Administered 2013-02-24: 3180 mL

## 2013-02-24 MED ORDER — PROPOFOL 10 MG/ML IV EMUL
INTRAVENOUS | Status: AC
  Filled 2013-02-24: qty 20

## 2013-02-24 MED ORDER — SILVER NITRATE-POT NITRATE 75-25 % EX MISC
CUTANEOUS | Status: DC | PRN
  Administered 2013-02-24: 3

## 2013-02-24 MED ORDER — SILVER NITRATE-POT NITRATE 75-25 % EX MISC
CUTANEOUS | Status: AC
  Filled 2013-02-24: qty 1

## 2013-02-24 MED ORDER — MIDAZOLAM HCL 2 MG/2ML IJ SOLN
INTRAMUSCULAR | Status: DC | PRN
  Administered 2013-02-24: 2 mg via INTRAVENOUS

## 2013-02-24 MED ORDER — LIDOCAINE HCL (CARDIAC) 20 MG/ML IV SOLN
INTRAVENOUS | Status: AC
Start: 2013-02-24 — End: 2013-02-24
  Filled 2013-02-24: qty 5

## 2013-02-24 MED ORDER — FENTANYL CITRATE 0.05 MG/ML IJ SOLN
INTRAMUSCULAR | Status: AC
  Administered 2013-02-24: 50 ug via INTRAVENOUS
  Filled 2013-02-24: qty 2

## 2013-02-24 MED ORDER — DEXAMETHASONE SODIUM PHOSPHATE 10 MG/ML IJ SOLN
INTRAMUSCULAR | Status: AC
  Filled 2013-02-24: qty 1

## 2013-02-24 MED ORDER — PROPOFOL INFUSION 10 MG/ML OPTIME
INTRAVENOUS | Status: DC | PRN
  Administered 2013-02-24: 200 mL via INTRAVENOUS

## 2013-02-24 MED ORDER — METOCLOPRAMIDE HCL 10 MG PO TABS
10.0000 mg | ORAL_TABLET | Freq: Four times a day (QID) | ORAL | Status: DC | PRN
  Filled 2013-02-24: qty 1

## 2013-02-24 SURGICAL SUPPLY — 29 items
BLADE INCISOR TRUC PLUS 2.9 (ABLATOR) IMPLANT
CANISTERS HI-FLOW 3000CC (CANNISTER) ×2 IMPLANT
CATH FOLEY 2WAY SLVR 30CC 16FR (CATHETERS) IMPLANT
CATH ROBINSON RED A/P 16FR (CATHETERS) ×2 IMPLANT
CLOTH BEACON ORANGE TIMEOUT ST (SAFETY) ×2 IMPLANT
CONTAINER PREFILL 10% NBF 60ML (FORM) ×3 IMPLANT
DRAPE HYSTEROSCOPY (DRAPE) ×2 IMPLANT
DRSG TELFA 3X8 NADH (GAUZE/BANDAGES/DRESSINGS) IMPLANT
ELECTRODE ROLLER VERSAPOINT (ELECTRODE) IMPLANT
ELECTRODE RT ANGLE VERSAPOINT (CUTTING LOOP) IMPLANT
GLOVE BIOGEL PI IND STRL 8 (GLOVE) ×1 IMPLANT
GLOVE BIOGEL PI INDICATOR 8 (GLOVE) ×1
GLOVE ECLIPSE 7.5 STRL STRAW (GLOVE) ×4 IMPLANT
GOWN STRL REIN XL XLG (GOWN DISPOSABLE) ×4 IMPLANT
INCISOR TRUC PLUS BLADE 2.9 (ABLATOR) ×2
KIT HYSTEROSCOPY TRUCLEAR (ABLATOR) ×1 IMPLANT
LOOP ANGLED CUTTING 22FR (CUTTING LOOP) IMPLANT
MORCELLATOR RECIP TRUCLEAR 4.0 (ABLATOR) IMPLANT
PACK VAGINAL MINOR WOMEN LF (CUSTOM PROCEDURE TRAY) ×2 IMPLANT
PAD DRESSING TELFA 3X8 NADH (GAUZE/BANDAGES/DRESSINGS) ×1 IMPLANT
PAD OB MATERNITY 4.3X12.25 (PERSONAL CARE ITEMS) ×2 IMPLANT
PAD PREP 24X48 CUFFED NSTRL (MISCELLANEOUS) ×2 IMPLANT
PLUG CATH AND CAP STER (CATHETERS) IMPLANT
SET BERKELEY SUCTION TUBING (SUCTIONS) IMPLANT
SYR 30ML LL (SYRINGE) IMPLANT
TOWEL OR 17X24 6PK STRL BLUE (TOWEL DISPOSABLE) ×4 IMPLANT
VACURETTE 8 RIGID CVD (CANNULA) IMPLANT
VACURETTE 9 RIGID CVD (CANNULA) IMPLANT
WATER STERILE IRR 1000ML POUR (IV SOLUTION) ×2 IMPLANT

## 2013-02-24 NOTE — Discharge Instructions (Signed)

## 2013-02-24 NOTE — Transfer of Care (Signed)
Immediate Anesthesia Transfer of Care Note  Patient: Terri Hunt  Procedure(s) Performed: Procedure(s) with comments: DILATATION & CURETTAGE/HYSTEROSCOPY WITH TRUCLEAR (N/A) - with resection of endometrial polyp    Patient Location: PACU  Anesthesia Type:General  Level of Consciousness: awake, alert  and oriented  Airway & Oxygen Therapy: Patient Spontanous Breathing and Patient connected to nasal cannula oxygen  Post-op Assessment: Report given to PACU RN and Post -op Vital signs reviewed and stable  Post vital signs: Reviewed and stable  Complications: No apparent anesthesia complications

## 2013-02-24 NOTE — Anesthesia Preprocedure Evaluation (Addendum)
Anesthesia Evaluation  Patient identified by MRN, date of birth, ID band Patient awake    Reviewed: Allergy & Precautions, H&P , NPO status , Patient's Chart, lab work & pertinent test results, reviewed documented beta blocker date and time   History of Anesthesia Complications Negative for: history of anesthetic complications  Airway Mallampati: III TM Distance: >3 FB Neck ROM: full    Dental  (+) Teeth Intact   Pulmonary sleep apnea (does not use her CPAP - sleeps with HOB elevated) , former smoker,  breath sounds clear to auscultation  Pulmonary exam normal       Cardiovascular negative cardio ROS  Rhythm:regular Rate:Normal     Neuro/Psych Seizures - (h/o two seizures in 2011, none since, on Keppra),  negative psych ROS   GI/Hepatic Neg liver ROS, GERD-  Medicated,  Endo/Other  diabetes, Type 2, Oral Hypoglycemic AgentsMorbid obesity  Renal/GU negative Renal ROS  Female GU complaint     Musculoskeletal   Abdominal   Peds  Hematology negative hematology ROS (+)   Anesthesia Other Findings   Reproductive/Obstetrics negative OB ROS                          Anesthesia Physical Anesthesia Plan  ASA: III  Anesthesia Plan: General LMA   Post-op Pain Management:    Induction:   Airway Management Planned:   Additional Equipment:   Intra-op Plan:   Post-operative Plan:   Informed Consent: I have reviewed the patients History and Physical, chart, labs and discussed the procedure including the risks, benefits and alternatives for the proposed anesthesia with the patient or authorized representative who has indicated his/her understanding and acceptance.   Dental Advisory Given  Plan Discussed with: CRNA and Surgeon  Anesthesia Plan Comments:         Anesthesia Quick Evaluation

## 2013-02-24 NOTE — Interval H&P Note (Signed)
History and Physical Interval Note:  02/24/2013 7:19 AM  Terri Hunt  has presented today for surgery, with the diagnosis of endometrial polyp  The various methods of treatment have been discussed with the patient and family. After consideration of risks, benefits and other options for treatment, the patient has consented to  Procedure(s) with comments: Keene (N/A) - with resection of endometrial polyp   as a surgical intervention .  The patient's history has been reviewed, patient examined, no change in status, stable for surgery.  I have reviewed the patient's chart and labs.  Questions were answered to the patient's satisfaction.     Terrance Mass

## 2013-02-24 NOTE — Op Note (Signed)
   02/24/2013  8:51 AM  PATIENT:  Terri Hunt  38 y.o. female  PRE-OPERATIVE DIAGNOSIS:  endometrial polyp  POST-OPERATIVE DIAGNOSIS:  endometrial polyp  PROCEDURE:  Procedure(s): DILATATION & CURETTAGE/HYSTEROSCOPY WITH TRUCLEAR  SURGEON:  Surgeon(s): Terrance Mass, MD  ANESTHESIA:   general  FINDINGS: The patient was found to have one anterior located uterine fundal fibroid as was seen on sonographic evaluation preop. Patient had several lower uterine segment polyps as well. Both tubal ostia were identified. Cervical canal clear after the lower uterine segment polyps were removed.  DESCRIPTION OF OPERATION:DESCRIPTION OF OPERATION: The patient was taken to the operating room where she underwent successful general endotracheal anesthesia. The patient was identified during the time out as well as procedure to be performed. The patient received Cefotan 2 g IV for infection prophylaxis and PAS stockings for DVT prophylaxis. The vagina and perineum were prepped and draped in usual sterile fashion and the legs were placed in the high lithotomy position. A red rubber Quentin Cornwall was inserted to evacuate the bladder was contents for approximately 50 cc . Exam under anesthesia demonstrated a uterus 4-6 week size with no palpable adnexal masses. A weighted speculum was placed on the posterior vaginal vault. A single-tooth tenaculum was placed on the anterior cervical lip. The uterus sounded to 8 cm. Pratt dilators to size 19 mm was utilized to dilate the cervical canal. The 5 mm Smith and Nephew operative resectoscope was utilized after introducing the operative sheath with obturator. The obturator was then removed the scope was inserted followed by insertion of the  True Clear Ultra 2.9 mm incisor blade. Normal saline was the distending media. It was noted at this time the patient had  one fundal anterior polyp and several more uterine segment polyps. Both tubal ostia were identified. . With the true  clear morcellator the endometrial polyps were resected and passed off the operative field for histological evaluation. Adequate hemostasis was present. Patient tolerated procedure well was extubated and transferred to recovery room stable vital signs.  EBL was minimal. Fluid deficit was 550 cc cc's.   ESTIMATED BLOOD LOSS: Intake/Output Summary (Last 24 hours) at 02/24/13 0851 Last data filed at 02/24/13 1601  Gross per 24 hour  Intake    900 ml  Output      0 ml  Net    900 ml     BLOOD ADMINISTERED:none   LOCAL MEDICATIONS USED:  NONE  SPECIMEN:  Source of Specimen:  Uterine polyps  DISPOSITION OF SPECIMEN:  PATHOLOGY  COUNTS:  YES  PLAN OF CARE: Transfer to PACU  [facility] HMD8:51 AMTD@  Note: This dictation was prepared with  Dragon/digital dictation along withSmart phrase technology. Any transcriptional errors that result from this process are unintentional.

## 2013-02-24 NOTE — H&P (View-Only) (Signed)
   Patient is a 38 year old who presented to the office today for sonohysterogram as part of her evaluation for dysfunctional uterine bleeding. She was seen in the office on December 9 and had been complaining of continuous vaginal bleeding since 12/22/2012.Patient states that she is sexually active and has been using condoms for contraception. Patient is currently part of a program at [facility] to help her lose weight whereby she is on a liquid diet consisting of 970 calories per day when she started in September 2014. The progress called Optifast and she has lost 92 pounds.  On that office visit an endometrial biopsy was done which demonstrated the following:  Diagnosis Endometrium, biopsy - INTERVAL PHASE ENDOMETRIUM (LATE PROLIFERATIVE / EARLY SECRETORY). - THERE IS NO EVIDENCE OF HYPERPLASIA OR MALIGNANCY  Blood work that was done in that office visit demonstrated a normal CBC, normal prolactin but her TSH was found to be elevated at 5.56 with a normal T4 and T3.  Her sonohysterogram today as follows: Uterus measuring 10.6 x 8.7 x 6.8 cm with endometrial stripe of 9.3 mm. Patient was found to have 7 fibroids the largest measuring 4.3 x 3.8 cm right ovary had a thin wall echo free a vascular cyst measuring 5.0 x 5.0 x 4.2 average size 4.7 cm. Left ovarian cyst interval low-level echoes measuring 16 x 15 mm positive color flow in the periphery suspicious as corpus luteum cyst. No fluid in the cul-de-sac. Normal saline was instilled in a sterile fashion into the uterine cavity and a right anterior uterine defect measured 10 x 9 x 9 mm was noted  Assessment/plan: #1 cervical polyp noted on the external also which was twisted off its pedicle with a ring forcep and submitted for histological evaluation #2 morbidly obese patient with dysfunctional uterine bleeding sonohysterogram demonstrated endometrial polyp. Patient will be scheduled for resectoscopic polypectomy.  Patient will be prescribed Megace 40 mg twice a day for the next 2-3 weeks. #3 elevated TSH (hypothyroidism) we'll repeat thyroid function test today. #4. When patient returns for preop visit we will do her Pap smear.

## 2013-02-24 NOTE — Anesthesia Postprocedure Evaluation (Signed)
  Anesthesia Post-op Note  Patient: Terri Hunt  Procedure(s) Performed: Procedure(s) with comments: DILATATION & CURETTAGE/HYSTEROSCOPY WITH TRUCLEAR (N/A) - with resection of endometrial polyp  Patient is awake and responsive. Pain and nausea are reasonably well controlled. Vital signs are stable and clinically acceptable. Oxygen saturation is clinically acceptable. There are no apparent anesthetic complications at this time. Patient is ready for discharge.

## 2013-02-27 ENCOUNTER — Encounter (HOSPITAL_COMMUNITY): Payer: Self-pay | Admitting: Gynecology

## 2013-03-01 NOTE — Addendum Note (Signed)
Addendum created 03/01/13 0914 by Lyndle Herrlich, MD   Modules edited: Anesthesia Attestations

## 2013-03-08 ENCOUNTER — Other Ambulatory Visit: Payer: Self-pay | Admitting: Gynecology

## 2013-03-08 ENCOUNTER — Ambulatory Visit: Payer: Managed Care, Other (non HMO) | Admitting: Gynecology

## 2013-03-08 ENCOUNTER — Telehealth: Payer: Self-pay

## 2013-03-08 ENCOUNTER — Encounter: Payer: Self-pay | Admitting: Gynecology

## 2013-03-08 MED ORDER — MEGESTROL ACETATE 40 MG PO TABS: 40.0000 mg | ORAL_TABLET | Freq: Two times a day (BID) | ORAL | Status: DC

## 2013-03-08 NOTE — Telephone Encounter (Signed)
Patient had sent email earlier and called this pm because she had not received response yet and wondered if she might need to come in.  When she called she said her bleeding had started to lighten and was half the flow it was when she emailed earlier.  She did say she was saturating a pad q 30 mins.  I recommended office visit and we scheduled her.  Meantime Dr Moshe Salisbury answered her email "Tell her the period after her surgery not unusal to be heavier with clots but we can call her for some additional Megace 40 mg BID for 10 days ".  I called her and she was not yet here for appointment.  I read her the recommendation and she said she is completely comfortable with watching this bleeding she had just wanted to know it was okay to be bleeding.  She said it is even half again what it was when we talked earlier and made the appt so it is steadily lightning up.  She opted to cancel the appt she had made and I set the Rx for Megace to her pharmacy.  She knows to call us if she needs [place] otherwise we will see her for her scheduled post op appt on Friday.

## 2013-03-10 ENCOUNTER — Ambulatory Visit (INDEPENDENT_AMBULATORY_CARE_PROVIDER_SITE_OTHER): Payer: Managed Care, Other (non HMO) | Admitting: Gynecology

## 2013-03-10 ENCOUNTER — Encounter: Payer: Self-pay | Admitting: Gynecology

## 2013-03-10 VITALS — BP 130/86

## 2013-03-10 DIAGNOSIS — Z4889 Encounter for other specified surgical aftercare: Secondary | ICD-10-CM

## 2013-03-10 NOTE — Progress Notes (Signed)
   Patient presented to the office today for a two-week postop visit. Patient is status post resectoscopic polypectomy on 02/24/2013. Patient is doing well had some bleeding episode yesterday but resolved.  Intraoperative findings as follows: The patient was found to have one anterior located uterine fundal fibroid as was seen on sonographic evaluation preop. Patient had several lower uterine segment polyps as well. Both tubal ostia were identified. Cervical canal clear after the lower uterine segment polyps were removed.  Pathology report: Diagnosis Endometrial polyp - SECRETORY ENDOMETRIUM AND BENIGN ENDOMETRIAL POLYP. NO HYPERPLASIA OR CARCINOMA.  Exam: Fourth and urethra Skene was within normal limits Vagina: No lesions or discharge Cervix: No lesions or discharge Bimanual exam: Unremarkable although limited because of patient's abdominal girth since she weighs over 300 pounds.  Assessment/plan: Patient 2 weeks status post resectoscopic polypectomy doing well. Patient contemplated getting pregnant into the ear. We went through a detailed discussion on issues such as advanced maternal age to include diabetes, hypertension, fetal aneuploidy, premature delivery as well as risk of cesarean section due to patient's morbid obesity. She is currently on 150-calorie liquid diet daily and as loss over 100 pounds since the start of the program less than 3 months ago. She is going to shoot for 250 pound before attempting pregnancy. Meanwhile asked her to continue to take her prenatal vitamins and LOC her back in one year or when necessary.

## 2013-04-03 ENCOUNTER — Other Ambulatory Visit: Payer: Self-pay | Admitting: Diagnostic Neuroimaging

## 2013-06-15 ENCOUNTER — Telehealth: Payer: Self-pay | Admitting: Diagnostic Neuroimaging

## 2013-06-15 MED ORDER — LEVETIRACETAM 500 MG PO TABS: ORAL_TABLET | ORAL | Status: DC

## 2013-06-15 NOTE — Telephone Encounter (Signed)
Patients sps calling regarding refill of levETIRAcetam (KEPPRA) 500 MG tablet faxed over from [facility].  Thanks

## 2013-06-15 NOTE — Telephone Encounter (Signed)
We have not gotten anything from [facility].  Rx has been sent.

## 2013-06-21 ENCOUNTER — Encounter: Payer: Self-pay | Admitting: Gynecology

## 2013-06-21 ENCOUNTER — Telehealth: Payer: Self-pay

## 2013-06-21 ENCOUNTER — Encounter: Payer: Self-pay | Admitting: Diagnostic Neuroimaging

## 2013-06-21 NOTE — Telephone Encounter (Signed)
I emailed patient back with name and phone number.

## 2013-06-21 NOTE — Telephone Encounter (Signed)
Received email from patient: "I have been able to get down to 260 pounds. In my last visit, we talked about me making an appointment with another doctor when I was ready to start trying to get pregnant. I would like to setup this appointment but could not remember the name of the doctor you mentioned. I hope all is well with you. Thank you"

## 2013-06-21 NOTE — Telephone Encounter (Signed)
Governor Specking MD reproductive endocrinologist

## 2013-06-22 ENCOUNTER — Telehealth: Payer: Self-pay | Admitting: *Deleted

## 2013-06-22 NOTE — Telephone Encounter (Signed)
Pt requested notes faxed to [place] office, this was done. They will contact patient to schedule.

## 2013-06-29 NOTE — Telephone Encounter (Signed)
Appointment on 07/14/13 @ 1:30 pm

## 2013-07-05 ENCOUNTER — Telehealth: Payer: Self-pay | Admitting: Diagnostic Neuroimaging

## 2013-07-05 NOTE — Telephone Encounter (Signed)
Patient communicated through My Chart requesting an earlier appointment (7/30) with Dr Leta Baptist  regarding medication and pregnancy.  She stated he told her to make earlier appointment, but she hadn't heard back.  Please return call.

## 2013-07-06 NOTE — Telephone Encounter (Signed)
Called pt to set up appt per Dr. Gladstone Lighter pt email. I made an appt on 07/10/13 with Dr. Leta Baptist. I advised the pt that if she has any other problems, questions or concerns to call the office. Pt verbalized understanding.

## 2013-07-10 ENCOUNTER — Encounter: Payer: Self-pay | Admitting: Diagnostic Neuroimaging

## 2013-07-10 ENCOUNTER — Ambulatory Visit (INDEPENDENT_AMBULATORY_CARE_PROVIDER_SITE_OTHER): Payer: Managed Care, Other (non HMO) | Admitting: Diagnostic Neuroimaging

## 2013-07-10 VITALS — BP 117/68 | HR 60 | Ht 62.5 in | Wt 262.0 lb

## 2013-07-10 DIAGNOSIS — G40909 Epilepsy, unspecified, not intractable, without status epilepticus: Secondary | ICD-10-CM

## 2013-07-10 MED ORDER — LEVETIRACETAM 250 MG PO TABS: 250.0000 mg | ORAL_TABLET | Freq: Two times a day (BID) | ORAL | Status: DC

## 2013-07-10 NOTE — Patient Instructions (Signed)
Reduce leveitracetam to 250mg  twice a day.  No driving until seizure free for 1 month on lower dose of levetiracetam.

## 2013-07-10 NOTE — Progress Notes (Signed)
[facility]  PATIENT: Terri Hunt DOB: 1975/06/20  REFERRING CLINICIAN:  HISTORY FROM: patient  REASON FOR VISIT: follow up   HISTORICAL  CHIEF COMPLAINT:  Chief Complaint  Patient presents with  . Follow-up    sz    HISTORY OF PRESENT ILLNESS:   UPDATE 07/10/13: Patient here to discuss preconception planning with respect to anti-seizure meds. She is planning to see fertility specialist soon, due to her age. Also, she has successfully los 167lbs in last 9 months with Optifast program throught [facility]. No seizures since 06/21/09. Tolerating LEV 500mg  BID.  UPDATE 03/16/12: Since last visit, no seizures. Tolerating meds. Now on metformin. Asked about pregnancy/conception issues and sz meds.   UPDATE 08/29/10: No seizures.  Tolerating LEV 500mg  BID.  No issues.  UPDATE 12/16/09: Doing well; no further seizures since 06/21/09.  tolerating LEV 500mg  BID.  UPDATE 08/23/09: No further seizures.  No on levetiracetam 500mg  BID only; off dilantin. Feels better on monotherapy.  Denies balance issues.  Some mood swings, but tolerable.  Not driving.  UPDATE 06/24/09: Had 2nd seizure just past midnight on 06/21/09.  Went to sleep on THursday evening at 11pm, then after midnight had stiffness of arms and legs, then mild jerking movements x 5 min.  This was witnessed by her husband who was awake.  She was unresponsive for 5 min, then gradually woke up.  EMS arived and took her to [facility].  Started on dilantin 300mg  qhs.  Tolerating meds.  Yesterday, in the heat, she felt lightheaded and decreased vision.  Sat down and then felt better.  PRIOR HPI:  38 year old right-handed female history of diabetes and hypercholesterolemia for evaluation of possible seizure on May 17, 2009.  Patient is accompanied by her husband at this visit.  Patient was asleep during the episode. She does not recall what type. According to her husband he woke up when the dog to go to bed. She looked over his wife and  noticed that she was unresponsive and having intermittent jerking movements of her arms. She had bit her tongue and there was blood coming from her mouth. He called 911 and when the paramedics arrived, they checked her blood sugar which was 145.  Over a period of 10 minutes she began to wake up but was confused and combative. She was evaluated at [facility] emergency room. CT scan of the head was unremarkable blood testing was also unremarkable. She was discharged home with followup in neurology clinic today.  Since that time she has had no further episodes. She has 2 cousins with seizures.  She denies history of head trauma or meningitis/encephalitis.  She hasn't had prior episodes like this.  Patient admits to being under increased stress with decreased sleep in the few days prior to this event.  Also, for several days following the event, she had episodes of gastic uprising, panic attack and facial flushing.  REVIEW OF SYSTEMS: Full 14 system review of systems performed and notable only for snoring, passing out (dehydration?), int headache, food allergies, rash.  ALLERGIES: Allergies  Allergen Reactions  . Advil [Ibuprofen] Shortness Of Breath    Can tolerate Alleve OK. Can tolerate Alleve OK.  Elmer Bales Meal   . Other Nausea Only    Honeydew and Cantaloupe    HOME MEDICATIONS: Outpatient Prescriptions Prior to Visit  Medication Sig Dispense Refill  . clotrimazole-betamethasone (LOTRISONE) cream Apply 1 application topically as needed (For Rash.).      . Loratadine (CLARITIN PO)  Take 10 mg by mouth daily.       Marland Kitchen METFORMIN HCL PO Take 250 mg by mouth daily.       . Methylcellulose, Laxative, (CITRUCEL PO) Take by mouth daily.       . pantoprazole (PROTONIX) 40 MG tablet Take 40 mg by mouth daily.      Marland Kitchen levETIRAcetam (KEPPRA) 500 MG tablet take 1 tablet by mouth twice a day  60 tablet  2  . megestrol (MEGACE) 40 MG tablet Take 1 tablet (40 mg total) by mouth 2 (two) times daily.  20  tablet  0  . Multiple Vitamin (MULTIVITAMIN) tablet Take 1 tablet by mouth daily.      . Pseudoephedrine-APAP-DM (DAYQUIL MULTI-SYMPTOM COLD/FLU PO) Take by mouth.       No facility-administered medications prior to visit.    PAST MEDICAL HISTORY: Past Medical History  Diagnosis Date  . Diabetes mellitus without complication   . GERD (gastroesophageal reflux disease)   . Allergy   . Hyperlipidemia     per patient - diet  controlled  . Sleep apnea     does not use CPAP - bed is elevated per patient  . Seizures     last seizure 06/2009   . Anemia     hx  . Dieting     Optifast Weight Loss Program thru [facility] since 10/06/2012 - current wgrt loss 100 lbs  . Endometrial polyp 2015    Resectoscopic polypectomy    PAST SURGICAL HISTORY: Past Surgical History  Procedure Laterality Date  . Cholecystectomy    . Hand surgery      right hand  . Wisdom tooth extraction    . Esophagogastroduodenoscopy endoscopy    . Dilatation & curettage/hysteroscopy with trueclear N/A 02/24/2013    Procedure: DILATATION & CURETTAGE/HYSTEROSCOPY WITH TRUCLEAR;  Surgeon: Terrance Mass, MD;  Location: [facility];  Service: Gynecology;  Laterality: N/A;  with resection of endometrial polyp    . Resectoscopic polypectomy  2015    FAMILY HISTORY: Family History  Problem Relation Age of Onset  . Diabetes Mother   . Cancer Father     SKIN  . Hypertension Father   . Cancer Paternal Uncle     SKIN  . Cancer Maternal Grandfather   . Heart disease Paternal Grandfather     SOCIAL HISTORY:  History   Social History  . Marital Status: Married    Spouse Name: Randall Hiss    Number of Children: 0  . Years of Education: College   Occupational History  .  [employer]   Social History Main Topics  . Smoking status: Former Smoker -- 0.50 packs/day for 4 years    Types: Cigarettes    Quit date: 01/11/2003  . Smokeless tobacco: Never Used  . Alcohol Use: No  . Drug Use: No  .  Sexual Activity: Yes    Birth Control/ Protection: Condom   Other Topics Concern  . Not on file   Social History Narrative   Patient lives at home with family.   Caffeine Use: 1-2 cups daily     PHYSICAL EXAM  Filed Vitals:   07/10/13 1112  BP: 117/68  Pulse: 60  Height: 5' 2.5" (1.588 m)  Weight: 262 lb (118.842 kg)    Not recorded    Body mass index is 47.13 kg/(m^2).  GENERAL EXAM: Patient is in no distress; well developed, nourished and groomed; neck is supple  CARDIOVASCULAR: Regular rate and  rhythm, no murmurs, no carotid bruits  NEUROLOGIC: MENTAL STATUS: awake, alert, language fluent, comprehension intact, naming intact, fund of knowledge appropriate CRANIAL NERVE: no papilledema on fundoscopic exam, pupils equal and reactive to light, visual fields full to confrontation, extraocular muscles intact, no nystagmus, facial sensation and strength symmetric, hearing intact, palate elevates symmetrically, uvula midline, shoulder shrug symmetric, tongue midline. MOTOR: normal bulk and tone, full strength in the BUE, BLE SENSORY: normal and symmetric to light touch, pinprick, temperature, vibration  COORDINATION: finger-nose-finger, fine finger movements, heel-shin normal REFLEXES: deep tendon reflexes present and symmetric GAIT/STATION: narrow based gait; able to walk on toes, heels and tandem; romberg is negative   DIAGNOSTIC DATA (LABS, IMAGING, TESTING) - I reviewed patient records, labs, notes, testing and imaging myself where available.  Lab Results  Component Value Date   WBC 8.7 02/16/2013   HGB 14.1 02/16/2013   HCT 42.2 02/16/2013   MCV 83.9 02/16/2013   PLT 313 02/16/2013      Component Value Date/Time   NA 139 02/16/2013 1135   K 4.7 02/16/2013 1135   CL 101 02/16/2013 1135   CO2 25 02/16/2013 1135   GLUCOSE 96 02/16/2013 1135   BUN 15 02/16/2013 1135   CREATININE 0.76 02/16/2013 1135   CALCIUM 9.7 02/16/2013 1135   GFRNONAA >90 02/16/2013 1135   GFRAA  >90 02/16/2013 1135   No results found for this basename: CHOL, HDL, LDLCALC, LDLDIRECT, TRIG, CHOLHDL   No results found for this basename: HGBA1C   No results found for this basename: VITAMINB12   Lab Results  Component Value Date   TSH 3.018 02/08/2013    05/27/09 MRI brain - there are a few non-specific foci of gliosis in the left, posterior frontal juxtacortical white matter (series 5, image 14, 15). No mesial temporal sclerosis or hippocampal atrophy.  06/07/09 EEG - normal  06/27/09 EEG - normal   ASSESSMENT AND PLAN  38 y.o. year old female here with seizure disorder, with 2 seizures in life ((05/17/09 and 06/21/09). No seizures since being on dilantin (then switched to LEV 500mg  BID for better side effect profile). Now interested in becoming pregnant. Will try her on lower dose LEV in anticipation of this. She is planning to see fertility specialist as well. Would recommend prenatal vitamin support and folic acid (at least 1mg  daily).   PLAN: - reduce LEV to 250mg  BID - no driving for next 1 month on lower dose LEV  Meds ordered this encounter  Medications  . levETIRAcetam (KEPPRA) 250 MG tablet    Sig: Take 1 tablet (250 mg total) by mouth 2 (two) times daily.    Dispense:  60 tablet    Refill:  12   Next appointment: 08/31/2013   Penni Bombard, MD 10/06/7652, 65:03 PM Certified in Neurology, Neurophysiology and Neuroimaging  [facility] [address] [place], [place] [address] [phone]

## 2013-07-30 DIAGNOSIS — D126 Benign neoplasm of colon, unspecified: Secondary | ICD-10-CM | POA: Insufficient documentation

## 2013-08-28 ENCOUNTER — Telehealth: Payer: Self-pay | Admitting: Diagnostic Neuroimaging

## 2013-08-28 NOTE — Telephone Encounter (Signed)
Pt is questioning if sooner appt is needed (10/14) to check medication decrease.  Rescheduled appt from 7/30 due to providers schedule...thanks

## 2013-08-29 NOTE — Telephone Encounter (Signed)
Spoke with patient and rescheduled October appt. to Aug. 5, 2015 @ 2:30PM.  Patient understood and was in agreement.  Her October appt. has been cancelled.

## 2013-08-31 ENCOUNTER — Ambulatory Visit: Payer: Self-pay | Admitting: Diagnostic Neuroimaging

## 2013-09-06 ENCOUNTER — Ambulatory Visit (INDEPENDENT_AMBULATORY_CARE_PROVIDER_SITE_OTHER): Payer: Managed Care, Other (non HMO) | Admitting: Diagnostic Neuroimaging

## 2013-09-06 ENCOUNTER — Encounter: Payer: Self-pay | Admitting: Diagnostic Neuroimaging

## 2013-09-06 VITALS — BP 122/69 | HR 60 | Temp 97.4°F | Ht 62.5 in | Wt 242.8 lb

## 2013-09-06 DIAGNOSIS — G40909 Epilepsy, unspecified, not intractable, without status epilepticus: Secondary | ICD-10-CM

## 2013-09-06 NOTE — Progress Notes (Signed)
[facility]  PATIENT: Terri Hunt DOB: [birth year]  REFERRING CLINICIAN:  HISTORY FROM: patient  REASON FOR VISIT: follow up   HISTORICAL  CHIEF COMPLAINT:  Chief Complaint  Patient presents with  . Follow-up    sz    HISTORY OF PRESENT ILLNESS:   UPDATE 09/06/13: Since last visit, doing well. Now on LEV 250mg  daily. No seizures. No issues. On another note, having some orthostatic lightheadedness with standing quickly. Continues to lose weigh intentionally. Seeing fertility specialist and planning artificial insemination.   UPDATE 07/10/13: Patient here to discuss preconception planning with respect to anti-seizure meds. She is planning to see fertility specialist soon, due to her age. Also, she has successfully los 167lbs in last 9 months with Optifast program throught [facility]. No seizures since 06/21/09. Tolerating LEV 500mg  BID.  UPDATE 03/16/12: Since last visit, no seizures. Tolerating meds. Now on metformin. Asked about pregnancy/conception issues and sz meds.   UPDATE 08/29/10: No seizures.  Tolerating LEV 500mg  BID.  No issues.  UPDATE 12/16/09: Doing well; no further seizures since 06/21/09.  tolerating LEV 500mg  BID.  UPDATE 08/23/09: No further seizures.  No on levetiracetam 500mg  BID only; off dilantin. Feels better on monotherapy.  Denies balance issues.  Some mood swings, but tolerable.  Not driving.  UPDATE 06/24/09: Had 2nd seizure just past midnight on 06/21/09.  Went to sleep on THursday evening at 11pm, then after midnight had stiffness of arms and legs, then mild jerking movements x 5 min.  This was witnessed by her husband who was awake.  She was unresponsive for 5 min, then gradually woke up.  EMS arived and took her to [facility].  Started on dilantin 300mg  qhs.  Tolerating meds.  Yesterday, in the heat, she felt lightheaded and decreased vision.  Sat down and then felt better.  PRIOR HPI:  38 year old right-handed female history of diabetes and  hypercholesterolemia for evaluation of possible seizure on May 17, 2009.  Patient is accompanied by her husband at this visit. Patient was asleep during the episode. She does not recall what type. According to her husband he woke up when the dog to go to bed. She looked over his wife and noticed that she was unresponsive and having intermittent jerking movements of her arms. She had bit her tongue and there was blood coming from her mouth. He called 911 and when the paramedics arrived, they checked her blood sugar which was 145.  Over a period of 10 minutes she began to wake up but was confused and combative. She was evaluated at [facility] emergency room. CT scan of the head was unremarkable blood testing was also unremarkable. She was discharged home with followup in neurology clinic today. Since that time she has had no further episodes. She has 2 cousins with seizures.  She denies history of head trauma or meningitis/encephalitis.  She hasn't had prior episodes like this.  Patient admits to being under increased stress with decreased sleep in the few days prior to this event.  Also, for several days following the event, she had episodes of gastic uprising, panic attack and facial flushing.   REVIEW OF SYSTEMS: Full 14 system review of systems performed and notable only for dizziness.   ALLERGIES: Allergies  Allergen Reactions  . Advil [Ibuprofen] Shortness Of Breath    Can tolerate Alleve OK. Can tolerate Alleve OK.  Elmer Bales Meal   . Other Nausea Only    Honeydew and Cantaloupe    HOME MEDICATIONS:  Outpatient Prescriptions Prior to Visit  Medication Sig Dispense Refill  . Loratadine (CLARITIN PO) Take 10 mg by mouth daily.       Marland Kitchen METFORMIN HCL PO Take 2,000 mg by mouth daily.       . pantoprazole (PROTONIX) 40 MG tablet Take 40 mg by mouth daily.      . Prenatal Vit-Fe Fumarate-FA (PRENATAL MULTIVITAMIN) TABS tablet Take 1 tablet by mouth daily at 12 noon.      . levETIRAcetam  (KEPPRA) 250 MG tablet Take 1 tablet (250 mg total) by mouth 2 (two) times daily.  60 tablet  12  . clotrimazole-betamethasone (LOTRISONE) cream Apply 1 application topically as needed (For Rash.).      Marland Kitchen Methylcellulose, Laxative, (CITRUCEL PO) Take by mouth daily.        No facility-administered medications prior to visit.    PAST MEDICAL HISTORY: Past Medical History  Diagnosis Date  . Diabetes mellitus without complication   . GERD (gastroesophageal reflux disease)   . Allergy   . Hyperlipidemia     per patient - diet  controlled  . Sleep apnea     does not use CPAP - bed is elevated per patient  . Seizures     last seizure 06/2009   . Anemia     hx  . Dieting     Optifast Weight Loss Program thru [facility] since 10/06/2012 - current wgrt loss 100 lbs  . Endometrial polyp 2015    Resectoscopic polypectomy    PAST SURGICAL HISTORY: Past Surgical History  Procedure Laterality Date  . Cholecystectomy    . Hand surgery      right hand  . Wisdom tooth extraction    . Esophagogastroduodenoscopy endoscopy    . Dilatation & curettage/hysteroscopy with trueclear N/A 02/24/2013    Procedure: DILATATION & CURETTAGE/HYSTEROSCOPY WITH TRUCLEAR;  Surgeon: Terrance Mass, MD;  Location: [facility];  Service: Gynecology;  Laterality: N/A;  with resection of endometrial polyp    . Resectoscopic polypectomy  2015    FAMILY HISTORY: Family History  Problem Relation Age of Onset  . Diabetes Mother   . Cancer Father     SKIN  . Hypertension Father   . Cancer Paternal Uncle     SKIN  . Cancer Maternal Grandfather   . Heart disease Paternal Grandfather     SOCIAL HISTORY:  History   Social History  . Marital Status: Married    Spouse Name: Randall Hiss    Number of Children: 0  . Years of Education: College   Occupational History  .  [employer]   Social History Main Topics  . Smoking status: Former Smoker -- 0.50 packs/day for 4 years    Types: Cigarettes      Quit date: 01/11/2003  . Smokeless tobacco: Never Used  . Alcohol Use: No  . Drug Use: No  . Sexual Activity: Yes    Birth Control/ Protection: Condom   Other Topics Concern  . Not on file   Social History Narrative   Patient lives at home with family.   Caffeine Use: 1-2 cups daily     PHYSICAL EXAM  Filed Vitals:   09/06/13 1440  BP: 122/69  Pulse: 60  Temp: 97.4 F (36.3 C)  TempSrc: Oral  Height: 5' 2.5" (1.588 m)  Weight: 242 lb 12.8 oz (110.133 kg)    Not recorded    Body mass index is 43.67 kg/(m^2).  GENERAL EXAM: Patient is  in no distress; well developed, nourished and groomed; neck is supple  CARDIOVASCULAR: Regular rate and rhythm, no murmurs, no carotid bruits  NEUROLOGIC: MENTAL STATUS: awake, alert, language fluent, comprehension intact, naming intact, fund of knowledge appropriate CRANIAL NERVE:  pupils equal and reactive to light, visual fields full to confrontation, extraocular muscles intact, no nystagmus, facial sensation and strength symmetric, hearing intact, palate elevates symmetrically, uvula midline, shoulder shrug symmetric, tongue midline. MOTOR: normal bulk and tone, full strength in the BUE, BLE SENSORY: normal and symmetric to light touch   COORDINATION: finger-nose-finger, fine finger movements normal REFLEXES: deep tendon reflexes present and symmetric GAIT/STATION: narrow based gait; romberg is negative   DIAGNOSTIC DATA (LABS, IMAGING, TESTING) - I reviewed patient records, labs, notes, testing and imaging myself where available.  Lab Results  Component Value Date   WBC 8.7 02/16/2013   HGB 14.1 02/16/2013   HCT 42.2 02/16/2013   MCV 83.9 02/16/2013   PLT 313 02/16/2013      Component Value Date/Time   NA 139 02/16/2013 1135   K 4.7 02/16/2013 1135   CL 101 02/16/2013 1135   CO2 25 02/16/2013 1135   GLUCOSE 96 02/16/2013 1135   BUN 15 02/16/2013 1135   CREATININE 0.76 02/16/2013 1135   CALCIUM 9.7 02/16/2013 1135    GFRNONAA >90 02/16/2013 1135   GFRAA >90 02/16/2013 1135   No results found for this basename: CHOL,  HDL,  LDLCALC,  LDLDIRECT,  TRIG,  CHOLHDL   No results found for this basename: HGBA1C   No results found for this basename: VITAMINB12   Lab Results  Component Value Date   TSH 3.018 02/08/2013    05/27/09 MRI brain - there are a few non-specific foci of gliosis in the left, posterior frontal juxtacortical white matter (series 5, image 14, 15). No mesial temporal sclerosis or hippocampal atrophy.  06/07/09 EEG - normal  06/27/09 EEG - normal   ASSESSMENT AND PLAN  38 y.o. year old female here with seizure disorder, with 2 seizures in life (05/17/09 and 06/21/09). No seizures since being on dilantin (then switched to LEV 500mg  BID for better side effect profile). Now pursing conception. Will continue tapering LEV off in anticipation of this.   PLAN: - continue LEV 250mg  daily x 2 weeks then stop  - no driving for next 1 month off LEV  Return in about 6 months (around 03/09/2014).    Penni Bombard, MD 0/07/3014, 0:10 PM Certified in Neurology, Neurophysiology and Neuroimaging  [facility] [address] [place], [place] [address] [phone]

## 2013-09-06 NOTE — Patient Instructions (Signed)
Continue tapering off levetiracetam.

## 2013-09-18 ENCOUNTER — Ambulatory Visit (INDEPENDENT_AMBULATORY_CARE_PROVIDER_SITE_OTHER): Payer: Managed Care, Other (non HMO) | Admitting: Neurology

## 2013-09-18 ENCOUNTER — Encounter: Payer: Self-pay | Admitting: Neurology

## 2013-09-18 VITALS — BP 131/72 | HR 46 | Resp 18 | Ht 63.5 in | Wt 238.0 lb

## 2013-09-18 DIAGNOSIS — G4733 Obstructive sleep apnea (adult) (pediatric): Secondary | ICD-10-CM | POA: Insufficient documentation

## 2013-09-18 NOTE — Addendum Note (Signed)
Addended by: Larey Seat on: 09/18/2013 04:31 PM   Modules accepted: Orders

## 2013-09-18 NOTE — Progress Notes (Signed)
SLEEP MEDICINE CLINIC   Provider:  Larey Seat, M D  Referring Provider: Precious Reel, MD Primary Care Physician:  Precious Reel, MD  Do I need CPAP ?   HPI:  NA Terri Hunt is a 38 y.o. female  Is seen here as a referral  from Dr. Virgina Jock for a sleep apnea consultation.  Terri Hunt and her husband are both attending the clinic today for the first time.  the patient had a sleep study with Dr. Tonia Ghent in in 2004 which diagnosed her wears a moderate degree to a sleep apnea and she was titrated to CPAP to 10 cm water pressure. She has not used CPAP in a while and has not felt that she needed it.  She seems to sleep well with an elevated head of bed and she may not at all need to rely on CPAP now after weight loss. The patient explained that she has lost an impressive amount of weight 192 pounds through a dietary program at [facility].   #1 We have to address if CPAP still necessary   #2 apnea is still present other alternative treatments besides CPAP,  that may now be easier to tolerate.   The patient reports that she usually goes to bed around midnight and usually falls asleep promptly.  She usually sleeps through the night and wakes between 6 and 7 AM to go to the bathroom.  She rises at 7 AM , her husband usually wakes her of her she would rely on alarm.  The patient still feels tired when she wakes up is not necessarily fully refreshed or restored.  She prefers the slightly elevated head of bed or a recliner, supine.    She does not nap, rarely has napped in the past.  She falls asleep when being a passenger. She is not driving due to a history of seizures, and is in the process of weaning off Keppra ( Dr. Leta Baptist).              Review of Systems: Out of a complete 14 system review, the patient complains of only the following symptoms, and all other reviewed systems are negative. The patient on occasion wakes up with a stiff neck, she does not have dysesthesias  in her hands, she does not wake up with headaches, does not wake up with a dry mouth.  The patient endorsed the PHQ2 score at 0 points .  Epworth score 7 , Fatigue severity score 20  , depression score 0.    History   Social History  . Marital Status: Married    Spouse Name: Terri Hunt    Number of Children: 0  . Years of Education: College   Occupational History  .  [employer]   Social History Main Topics  . Smoking status: Former Smoker -- 0.50 packs/day for 4 years    Types: Cigarettes    Quit date: 01/11/2003  . Smokeless tobacco: Never Used  . Alcohol Use: No  . Drug Use: No  . Sexual Activity: Yes    Birth Control/ Protection: Condom   Other Topics Concern  . Not on file   Social History Narrative   Patient is married Terri Hunt) Patient lives at home with family.   Patient works full-time.   Patient has a college degree.   Patient is right-handed.   Caffeine Use: 1-2 cups daily ( soda or coffee)    Family History  Problem Relation Age of Onset  .  Diabetes Mother   . Cancer Father     SKIN  . Hypertension Father   . Cancer Paternal Uncle     prostate  . Cancer Maternal Grandfather   . Heart disease Paternal Grandfather   . Parkinson's disease Paternal Grandfather   . Bipolar disorder Sister   . Sleep apnea Mother   . Sleep apnea Maternal Grandmother   . Sleep apnea Brother     Past Medical History  Diagnosis Date  . Diabetes mellitus without complication   . GERD (gastroesophageal reflux disease)   . Allergy   . Hyperlipidemia     per patient - diet  controlled  . Sleep apnea     does not use CPAP - bed is elevated per patient  . Seizures     last seizure 06/2009   . Anemia     hx  . Dieting     [facility] Weight Loss Program thru [facility] since 10/06/2012 - current wgrt loss 100 lbs  . Endometrial polyp 2015    Resectoscopic polypectomy  . 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 Cholecystectomy  2001  . Hand surgery      right hand  . Wisdom tooth extraction    . Esophagogastroduodenoscopy endoscopy    . Dilatation & curettage/hysteroscopy with trueclear N/A 02/24/2013    Procedure: DILATATION & CURETTAGE/HYSTEROSCOPY WITH TRUCLEAR;  Surgeon: Terrance Mass, MD;  Location: [facility];  Service: Gynecology;  Laterality: N/A;  with resection of endometrial polyp   . Resectoscopic polypectomy  2015    Current Outpatient Prescriptions  Medication Sig Dispense Refill  . Choriogonadotropin Alfa (OVIDREL) 250 MCG/0.5ML injection Inject 250 mcg into the skin every 30 (thirty) days.      . folic acid (FOLVITE) 1 MG tablet 4 tablets daily.      Marland Kitchen letrozole (FEMARA) 2.5 MG tablet Take 2.5 mg by mouth daily. For 5 days; days 3-7      . levETIRAcetam (KEPPRA) 250 MG tablet Take 250 mg by mouth daily.      . Loratadine (CLARITIN PO) Take 10 mg by mouth daily.       Marland Kitchen METFORMIN HCL PO Take 2,000 mg by mouth daily.       . pantoprazole (PROTONIX) 40 MG tablet Take 40 mg by mouth daily.      . Prenatal Vit-Fe Fumarate-FA (PRENATAL MULTIVITAMIN) TABS tablet Take 1 tablet by mouth daily at 12 noon.       No current facility-administered medications for this visit.    Allergies as of 09/18/2013 - Review Complete 09/18/2013  Allergen Reaction Noted  . Advil [ibuprofen] Shortness Of Breath 01/04/2013  . Almond meal  07/10/2013  . Fluorouracil  09/18/2013  . Other Nausea Only 07/10/2013    Vitals: BP 131/72  Pulse 46  Resp 18  Ht 5' 3.5" (1.613 m)  Wt 238 lb (107.956 kg)  BMI 41.49 kg/m2  LMP 09/15/2013 Last Weight:  Wt Readings from Last 1 Encounters:  09/18/13 238 lb (107.956 kg)       Last Height:   Ht Readings from Last 1 Encounters:  09/18/13 5' 3.5" (1.613 m)    Physical exam:  General: The patient is awake, alert and appears not in acute distress. The patient is well groomed. Head: Normocephalic, atraumatic. Neck is supple.  Mallampati 4 ,  neck circumference: 16.5 . Nasal airflow unrestricted ,  TMJ is  evident . Retrognathia is not seen.  Cardiovascular:  Regular rate and rhythm , without  murmurs or carotid bruit, and without distended neck veins. Respiratory: Lungs are clear to auscultation. Skin:  Without evidence of edema, or rash Trunk: BMI is elevated and patient has normal posture.  Neurologic exam : The patient is awake and alert, oriented to place and time.   Memory subjective described as intact. There is a normal attention span & concentration ability.  Speech is fluent without dysarthria, dysphonia or aphasia. Mood and affect are appropriate.  Cranial nerves: Pupils are equal and briskly reactive to light. Funduscopic exam without  evidence of pallor or edema.  Extraocular movements  in vertical and horizontal planes intact and without nystagmus. Visual fields by finger perimetry are intact. Hearing to finger rub intact.  Facial sensation intact to fine touch. Facial motor strength is symmetric and tongue and uvula move midline.  Motor exam:   Normal tone ,muscle bulk and symmetric ,strength in all extremities.  Sensory:  Fine touch, pinprick and vibration were tested in all extremities.  Proprioception is normal.  Coordination: Rapid alternating movements in the fingers/hands is normal.  Gait and station: Patient walks without assistive device  Deep tendon reflexes: in the  upper and lower extremities are attenuated  Babinski maneuver response is   downgoing.   Assessment:  After physical and neurologic examination, review of laboratory studies, imaging, neurophysiology testing and pre-existing records, assessment is  1) the patient may still have sep apnea, as her BMI is still elevated, but the settings of her current Machine are probably not longer adequate.    The patient was advised of the nature of the diagnosed sleep disorder , the treatment options and risks for general a health and  wellness arising from not treating the condition.  Visit duration was 30 minutes.   Plan:  Treatment plan and additional workup : 1) Split night , AHI 15 , CO2 and score at 3 %.      Asencion Partridge Sulayman Manning MD  09/18/2013

## 2013-09-18 NOTE — Patient Instructions (Signed)
Sleep Apnea  Sleep apnea is a sleep disorder characterized by abnormal pauses in breathing while you sleep. When your breathing pauses, the level of oxygen in your blood decreases. This causes you to move out of deep sleep and into light sleep. As a result, your quality of sleep is poor, and the system that carries your blood throughout your body (cardiovascular system) experiences stress. If sleep apnea remains untreated, the following conditions can develop:  High blood pressure (hypertension).  Coronary artery disease.  Inability to achieve or maintain an erection (impotence).  Impairment of your thought process (cognitive dysfunction). There are three types of sleep apnea: 1. Obstructive sleep apnea--Pauses in breathing during sleep because of a blocked airway. 2. Central sleep apnea--Pauses in breathing during sleep because the area of the brain that controls your breathing does not send the correct signals to the muscles that control breathing. 3. Mixed sleep apnea--A combination of both obstructive and central sleep apnea. RISK FACTORS The following risk factors can increase your risk of developing sleep apnea:  Being overweight.  Smoking.  Having narrow passages in your nose and throat.  Being of older age.  Being female.  Alcohol use.  Sedative and tranquilizer use.  Ethnicity. Among individuals younger than 35 years, African Americans are at increased risk of sleep apnea. SYMPTOMS   Difficulty staying asleep.  Daytime sleepiness and fatigue.  Loss of energy.  Irritability.  Loud, heavy snoring.  Morning headaches.  Trouble concentrating.  Forgetfulness.  Decreased interest in sex. DIAGNOSIS  In order to diagnose sleep apnea, your caregiver will perform a physical examination. Your caregiver may suggest that you take a home sleep test. Your caregiver may also recommend that you spend the night in a sleep lab. In the sleep lab, several monitors record  information about your heart, lungs, and brain while you sleep. Your leg and arm movements and blood oxygen level are also recorded. TREATMENT The following actions may help to resolve mild sleep apnea:  Sleeping on your side.   Using a decongestant if you have nasal congestion.   Avoiding the use of depressants, including alcohol, sedatives, and narcotics.   Losing weight and modifying your diet if you are overweight. There also are devices and treatments to help open your airway:  Oral appliances. These are custom-made mouthpieces that shift your lower jaw forward and slightly open your bite. This opens your airway.  Devices that create positive airway pressure. This positive pressure "splints" your airway open to help you breathe better during sleep. The following devices create positive airway pressure:  Continuous positive airway pressure (CPAP) device. The CPAP device creates a continuous level of air pressure with an air pump. The air is delivered to your airway through a mask while you sleep. This continuous pressure keeps your airway open.  Nasal expiratory positive airway pressure (EPAP) device. The EPAP device creates positive air pressure as you exhale. The device consists of single-use valves, which are inserted into each nostril and held in place by adhesive. The valves create very little resistance when you inhale but create much more resistance when you exhale. That increased resistance creates the positive airway pressure. This positive pressure while you exhale keeps your airway open, making it easier to breath when you inhale again.  Bilevel positive airway pressure (BPAP) device. The BPAP device is used mainly in patients with central sleep apnea. This device is similar to the CPAP device because it also uses an air pump to deliver continuous air pressure   through a mask. However, with the BPAP machine, the pressure is set at two different levels. The pressure when you  exhale is lower than the pressure when you inhale.  Surgery. Typically, surgery is only done if you cannot comply with less invasive treatments or if the less invasive treatments do not improve your condition. Surgery involves removing excess tissue in your airway to create a wider passage way. Document Released: 01/09/2002 Document Revised: 05/16/2012 Document Reviewed: 05/28/2011 ExitCare Patient Information 2015 [employer]. This information is not intended to replace advice given to you by your health care provider. Make sure you discuss any questions you have with your health care provider.  

## 2013-09-24 ENCOUNTER — Emergency Department (HOSPITAL_BASED_OUTPATIENT_CLINIC_OR_DEPARTMENT_OTHER)
Admission: EM | Admit: 2013-09-24 | Discharge: 2013-09-24 | Disposition: A | Discharge status: Home / Self Care | Payer: Managed Care, Other (non HMO) | Attending: Emergency Medicine | Admitting: Emergency Medicine

## 2013-09-24 ENCOUNTER — Encounter (HOSPITAL_BASED_OUTPATIENT_CLINIC_OR_DEPARTMENT_OTHER): Payer: Self-pay | Admitting: Emergency Medicine

## 2013-09-24 DIAGNOSIS — E119 Type 2 diabetes mellitus without complications: Secondary | ICD-10-CM | POA: Diagnosis not present

## 2013-09-24 DIAGNOSIS — Y929 Unspecified place or not applicable: Secondary | ICD-10-CM | POA: Diagnosis not present

## 2013-09-24 DIAGNOSIS — Z8742 Personal history of other diseases of the female genital tract: Secondary | ICD-10-CM | POA: Insufficient documentation

## 2013-09-24 DIAGNOSIS — X58XXXA Exposure to other specified factors, initial encounter: Secondary | ICD-10-CM | POA: Insufficient documentation

## 2013-09-24 DIAGNOSIS — G40909 Epilepsy, unspecified, not intractable, without status epilepticus: Secondary | ICD-10-CM | POA: Diagnosis not present

## 2013-09-24 DIAGNOSIS — Y9389 Activity, other specified: Secondary | ICD-10-CM | POA: Insufficient documentation

## 2013-09-24 DIAGNOSIS — S6990XA Unspecified injury of unspecified wrist, hand and finger(s), initial encounter: Secondary | ICD-10-CM | POA: Insufficient documentation

## 2013-09-24 DIAGNOSIS — Z23 Encounter for immunization: Secondary | ICD-10-CM | POA: Diagnosis not present

## 2013-09-24 DIAGNOSIS — S6980XA Other specified injuries of unspecified wrist, hand and finger(s), initial encounter: Secondary | ICD-10-CM | POA: Insufficient documentation

## 2013-09-24 DIAGNOSIS — Z8639 Personal history of other endocrine, nutritional and metabolic disease: Secondary | ICD-10-CM | POA: Insufficient documentation

## 2013-09-24 DIAGNOSIS — Z87891 Personal history of nicotine dependence: Secondary | ICD-10-CM | POA: Insufficient documentation

## 2013-09-24 DIAGNOSIS — Z79899 Other long term (current) drug therapy: Secondary | ICD-10-CM | POA: Diagnosis not present

## 2013-09-24 DIAGNOSIS — K219 Gastro-esophageal reflux disease without esophagitis: Secondary | ICD-10-CM | POA: Diagnosis not present

## 2013-09-24 DIAGNOSIS — IMO0002 Reserved for concepts with insufficient information to code with codable children: Secondary | ICD-10-CM

## 2013-09-24 DIAGNOSIS — D649 Anemia, unspecified: Secondary | ICD-10-CM | POA: Insufficient documentation

## 2013-09-24 DIAGNOSIS — Z862 Personal history of diseases of the blood and blood-forming organs and certain disorders involving the immune mechanism: Secondary | ICD-10-CM | POA: Diagnosis not present

## 2013-09-24 MED ORDER — SILVER SULFADIAZINE 1 % EX CREA
TOPICAL_CREAM | CUTANEOUS | Status: AC
  Filled 2013-09-24: qty 85

## 2013-09-24 MED ORDER — HYDROCODONE-ACETAMINOPHEN 5-325 MG PO TABS
1.0000 | ORAL_TABLET | Freq: Once | ORAL | Status: AC
  Administered 2013-09-24: 1 via ORAL
  Filled 2013-09-24: qty 1

## 2013-09-24 MED ORDER — HYDROCODONE-ACETAMINOPHEN 5-325 MG PO TABS: 1.0000 | ORAL_TABLET | ORAL | Status: DC | PRN

## 2013-09-24 MED ORDER — SILVER SULFADIAZINE 1 % EX CREA
TOPICAL_CREAM | Freq: Once | CUTANEOUS | Status: AC
  Administered 2013-09-24: 16:00:00 via TOPICAL

## 2013-09-24 MED ORDER — TETANUS-DIPHTH-ACELL PERTUSSIS 5-2.5-18.5 LF-MCG/0.5 IM SUSP
0.5000 mL | Freq: Once | INTRAMUSCULAR | Status: DC
  Filled 2013-09-24: qty 0.5

## 2013-09-24 NOTE — ED Provider Notes (Signed)
CSN: [id number]     Arrival date & time 09/24/13  1134 History   First MD Initiated Contact with Patient 09/24/13 1407     Chief Complaint  Patient presents with  . Finger Injury     (Consider location/radiation/quality/duration/timing/severity/associated sxs/prior Treatment) Patient is a 38 y.o. female presenting with burn. The history is provided by the patient. No language interpreter was used.  Burn Burn location:  Hand Hand burn location:  L fingers and R fingers Associated symptoms comment:  She was participating in a rope course and fell, stopping her fall while holding onto a rope with both hands causing burns to multiple fingers. NO other injury. No injury from fall.    Past Medical History  Diagnosis Date  . Diabetes mellitus without complication   . GERD (gastroesophageal reflux disease)   . Allergy   . Hyperlipidemia     per patient - diet  controlled  . Sleep apnea     does not use CPAP - bed is elevated per patient  . Seizures     last seizure 06/2009   . Anemia     hx  . Dieting     [facility]ifast Weight Loss Program thru [facility] since 10/06/2012 - current wgrt loss 100 lbs  . Endometrial polyp 2015    Resectoscopic polypectomy  . 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 Cholecystectomy  2001  . Hand surgery      right hand  . Wisdom tooth extraction    . Esophagogastroduodenoscopy endoscopy    . Dilatation & curettage/hysteroscopy with trueclear N/A 02/24/2013    Procedure: DILATATION & CURETTAGE/HYSTEROSCOPY WITH TRUCLEAR;  Surgeon: Terrance Mass, MD;  Location: [facility];  Service: Gynecology;  Laterality: N/A;  with resection of endometrial polyp   . Resectoscopic polypectomy  2015   Family History  Problem Relation Age of Onset  . Diabetes Mother   . Cancer Father     SKIN  . Hypertension Father   . Cancer Paternal Uncle     prostate  . Cancer Maternal Grandfather   .  Heart disease Paternal Grandfather   . Parkinson's disease Paternal Grandfather   . Bipolar disorder Sister   . Sleep apnea Mother   . Sleep apnea Maternal Grandmother   . Sleep apnea Brother    History  Substance Use Topics  . Smoking status: Former Smoker -- 0.50 packs/day for 4 years    Types: Cigarettes    Quit date: 01/11/2003  . Smokeless tobacco: Never Used  . Alcohol Use: No   OB History   Grav Para Term Preterm Abortions TAB SAB Ect Mult Living   0              Review of Systems  Constitutional: Negative for fever.  Skin: Positive for wound.  Neurological: Negative for numbness.      Allergies  Advil; Almond meal; Fluorouracil; and Other  Home Medications   Prior to Admission medications   Medication Sig Start Date End Date Taking? Authorizing Provider  Choriogonadotropin Alfa (OVIDREL) 250 MCG/0.5ML injection Inject 250 mcg into the skin every 30 (thirty) days.    Historical Provider, MD  folic acid (FOLVITE) 1 MG tablet 4 tablets daily. 09/10/13   Historical Provider, MD  letrozole (FEMARA) 2.5 MG tablet Take 2.5 mg by mouth daily. For 5 days; days 3-7    Historical Provider, MD  levETIRAcetam (KEPPRA)  250 MG tablet Take 250 mg by mouth daily. 07/10/13   Penni Bombard, MD  Loratadine (CLARITIN PO) Take 10 mg by mouth daily.     Historical Provider, MD  METFORMIN HCL PO Take 2,000 mg by mouth daily.     Historical Provider, MD  pantoprazole (PROTONIX) 40 MG tablet Take 40 mg by mouth daily.    Historical Provider, MD  Prenatal Vit-Fe Fumarate-FA (PRENATAL MULTIVITAMIN) TABS tablet Take 1 tablet by mouth daily at 12 noon.    Historical Provider, MD   BP 131/70  Pulse 73  Temp(Src) 97.9 F (36.6 C) (Oral)  Resp 18  Ht 5\' 3"  (1.6 m)  Wt 234 lb (106.142 kg)  BMI 41.46 kg/m2  SpO2 100%  LMP 09/15/2013 Physical Exam  Constitutional: She is oriented to person, place, and time. She appears well-developed and well-nourished. No distress.  Musculoskeletal:   FROM all joints of involved fingers. No tendon deficits.   Neurological: She is alert and oriented to person, place, and time.  Skin:  Cutaneous abrasions to 2nd, 3rd, 4th palmar digits of left hand and 3rd and 4th palmar digits of right hand. Isolated lesions, none crossing the palmar flexures.   Psychiatric: She has a normal mood and affect.    ED Course  Procedures (including critical care time) Labs Review Labs Reviewed - No data to display  Imaging Review No results found.   EKG Interpretation None      MDM   Final diagnoses:  None    1. Finger abrasions, bilateral  Wounds debrided and cleaned. Topical Silvadene and bandages applied. Encouraged close PCP follow up for recheck.     Dewaine Oats, PA-C 09/24/13 1514

## 2013-09-24 NOTE — ED Notes (Signed)
Patient here with abrasions from rope burns after grabbing ropes while falling from rope course

## 2013-09-24 NOTE — Discharge Instructions (Signed)
Burn Care Your skin is a natural barrier to infection. It is the largest organ of your body. Burns damage this natural protection. To help prevent infection, it is very important to follow your caregiver's instructions in the care of your burn. Burns are classified as:  First degree. There is only redness of the skin (erythema). No scarring is expected.  Second degree. There is blistering of the skin. Scarring may occur with deeper burns.  Third degree. All layers of the skin are injured, and scarring is expected. HOME CARE INSTRUCTIONS   Wash your hands well before changing your bandage.  Change your bandage as often as directed by your caregiver.  Remove the old bandage. If the bandage sticks, you may soak it off with cool, clean water.  Cleanse the burn thoroughly but gently with mild soap and water.  Pat the area dry with a clean, dry cloth.  Apply a thin layer of antibacterial cream to the burn.  Apply a clean bandage as instructed by your caregiver.  Keep the bandage as clean and dry as possible.  Elevate the affected area for the first 24 hours, then as instructed by your caregiver.  Only take over-the-counter or prescription medicines for pain, discomfort, or fever as directed by your caregiver. SEEK IMMEDIATE MEDICAL CARE IF:   You develop excessive pain.  You develop redness, tenderness, swelling, or red streaks near the burn.  The burned area develops yellowish-white fluid (pus) or a bad smell.  You have a fever. MAKE SURE YOU:   Understand these instructions.  Will watch your condition.  Will get help right away if you are not doing well or get worse. Document Released: 01/19/2005 Document Revised: 04/13/2011 Document Reviewed: 06/11/2010 ExitCare Patient Information 2015 [employer]. This information is not intended to replace advice given to you by your health care provider. Make sure you discuss any questions you have with your health care  provider.  

## 2013-09-24 NOTE — ED Notes (Signed)
Discharge charted in error

## 2013-09-26 NOTE — ED Provider Notes (Signed)
Medical screening examination/treatment/procedure(s) were performed by non-physician practitioner and as supervising physician I was immediately available for consultation/collaboration.   EKG Interpretation None        Ephraim Hamburger, MD 09/26/13 [phone]

## 2013-11-15 ENCOUNTER — Ambulatory Visit: Payer: Managed Care, Other (non HMO) | Admitting: Diagnostic Neuroimaging

## 2013-11-17 ENCOUNTER — Other Ambulatory Visit: Payer: Self-pay

## 2013-11-19 ENCOUNTER — Ambulatory Visit (INDEPENDENT_AMBULATORY_CARE_PROVIDER_SITE_OTHER): Payer: Managed Care, Other (non HMO)

## 2013-11-19 VITALS — BP 135/73

## 2013-11-19 DIAGNOSIS — G4733 Obstructive sleep apnea (adult) (pediatric): Secondary | ICD-10-CM

## 2013-11-19 DIAGNOSIS — G4761 Periodic limb movement disorder: Secondary | ICD-10-CM

## 2013-11-28 ENCOUNTER — Telehealth: Payer: Self-pay | Admitting: *Deleted

## 2013-11-28 NOTE — Telephone Encounter (Signed)
Patient was contacted and provided the results of her overnight sleep study that revealed mild overall sleep apnea.  Patient was mailed a copy of the results and a copy was sent to Dr. Virgina Jock.  Patient requested to view her results and then would contact our office to schedule a follow up visit.

## 2014-01-08 ENCOUNTER — Other Ambulatory Visit: Payer: Self-pay | Admitting: Dermatology

## 2014-02-27 DIAGNOSIS — F1290 Cannabis use, unspecified, uncomplicated: Secondary | ICD-10-CM | POA: Insufficient documentation

## 2014-03-12 ENCOUNTER — Ambulatory Visit: Payer: Self-pay | Admitting: Diagnostic Neuroimaging

## 2014-07-13 ENCOUNTER — Ambulatory Visit (HOSPITAL_BASED_OUTPATIENT_CLINIC_OR_DEPARTMENT_OTHER)
Payer: No Typology Code available for payment source | Attending: Internal Medicine | Admitting: Rehabilitative and Restorative Service Providers"

## 2014-07-13 DIAGNOSIS — M778 Other enthesopathies, not elsewhere classified: Secondary | ICD-10-CM | POA: Insufficient documentation

## 2014-07-13 DIAGNOSIS — M79641 Pain in right hand: Secondary | ICD-10-CM | POA: Insufficient documentation

## 2014-07-13 HISTORY — DX: Other enthesopathies, not elsewhere classified: M77.8

## 2014-07-13 HISTORY — DX: Pain in right hand: M79.641

## 2014-07-13 NOTE — Progress Notes (Signed)
HAND THERAPY INITIAL PLAN OF CARE            Certification      From:  07/13/2014            Through:   08/12/14     ONSET DATE:  06/15/14  START OF CARE DATE: 07/13/2014  VISITS FROM SOC:   1/?  TOTAL UNITS USED TO DATE: n/a  Communication with insurance provider: n/a  Claim Number:  [id number]  Interpreter needed? No  Referring Provider:  Greig Castilla  Diagnosis: R hand tendonitis     PRECAUTIONS:  none  Mechanism of Injury: Over use    Pertinent Medical/Surgical History: Date of Surgery: n/a  Surgery Performed n/a   Past Medical History   Diagnosis Date   . ADJUSTMENT REACTION NOS      current hx of depression tx with Wellbutrin   . ABNORMAL PAP SMEAR-CERVIX 04/1998     secondary to cervicitis-tx and then normal   . DYSMENORRHEA      NSAIDS      Past Surgical History   Procedure Laterality Date   . Tonsillectomy & adenoidectomy <age 53  1979 &81     T & A < 12yo   . 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     Hand Dominance: right  ____________________________________________________________________    Previous Therapy: none  Prior Level of Function:   Work/Occupation:  data entry  ADL's:  independent  Home Management Tasks: independent  Mobility:  independent    Home Environment: Apartment/House:  Living with support    SUBJECTIVE:  Patient's Statement: "I have changed my desk around a little bit and it has helped"    OBJECTIVE MEASURES / IMPAIRMENTS:  AROM:    right  Wrist:   Extension/Flexion   60/65  Supination/Pronation  Full as compared to contralateral side    right  Digits:    Able to make full composite fist  right Thumb:    Able to touch tip of thumb to base of SF    Reports increased pain 4/10 with lumbrical plus position of right hand;       Wound:     Edema:    Not observed    Sensation:    Reports within functional limits    Strength: ( 3 trials with with mean reported)  Grip  R 58 lbs  L 55 lbs    Lateral Pinch  R  20 lbs  L 19 lbs    3 point  Pinch  R 15 lbs  L 14 lbs    Trigger finger not observed or present with testing Left hand.            TREATMENT & EDUCATION    Primary Learner: Patient  Home Exercise Program and Orthosis Wear and Care Procedures  Challenges Impacting this Teaching: None  Desire and Motivation to Learn: Engaging with education, ask questions  Post Education Response:  Demonstrates tasks independently      Intervention 1: Occupational Therapy 97003 evaluation for 30 minutes.   Objective measures taken, reviewed precautions related to diagnosis and anticipated outcomes with therapy.  Goals set.      Instructed forearm stretches for extrinsic tightness  Instructed gentle AROM; TGEs for gentle glide of motion          Intervention 3:  L-code: J1884  Total time: 10 min   Type of Orthotic issued :  A   right IF yoke ext orthosis was fabricated by this therapist on 07/13/2014. Pt. was given verbal and written instructions for wear, care, precautions, and clinic contact information should problems arise.   Patient endorsed comfort and agreed with purpose and goal of orthosis function.      Statement of Medical Necessity / Purpose of Orthosis: provide positioning with MP in slight ext to allow intrinsic rest with typing .      Home Exercise Program   Orthosis during daytime    Preferred Learning Style   Demonstration     Time in:   1300  Total Treatment Minutes:   40   Total Timed Code Minutes:  0      verbalizing appropriate understanding of the exercises and any precautions and demonstrating the ability to don and doff the orthosis correctly    ASSESSMENT:    Jashawna presents with ongoing pain in dominant hand, right, index finger and forearm; consistent with tendonitis.  She has been proactive for positioning desk and computer at work and has started compensatory strategies to provide rest to painful hand/wrist.   Yoke extension splint fabricated to provide position of rest to index finger to be worn daily.  Anticipate Raejean to do well  with home program for motion and rest; will need follow up therapy visits to advance strength and check orthosis      Outcomes Score: [facility]: Functional Index Score 44 /50      Fall Risk:  Not a fall risk based on screening.     PLAN:   Pain will be addressed at the next plan of care review, and intermittently during daily visits as deemed appropriate by the provider.  The focus of daily visits will be on function.    This patient will be seen for 1 therapy session every 2 weeks for 6 weeks.    To improve impairments and reach functional goals the following interventions will be performed:: therapeutic exercises , manual therapy techniques, orthotic management , therapeutic activities , community work integration  and ultrasound    Plan for next visit:  Check orthosis,  Progress with intrinsic strength if pain free.  Trial K-tape for extrinsic tightness if indicated    DISCHARGE GOALS - Anticipated Discharge Date 09/12/2014  1. Pt will return to computer and phone use with right hand without pain.      Current Level of Function /  Progress Toward Goal NEW Short Term Goals / Plan of Care Review    Date Updated:  07/13/2014 Date to be Reviewed:  08/12/14  MET / Benard Halsted   Has pain with type on phone with right index finger    Pt will report <3/10 pain at all times    Pt unable to open food packages   Pt will demonstrate >15# lateral pinch strength in Right hand               Rehabilitation potential is: Good    Potential Barriers to achieve rehab goals:  Chronicity or severity and none    These goals were discussed and Patient agrees with plan.    Cultural Practices that Influence Care: none    Nicole [name]  OTR/L    [facility]  Phone: [phone]   FAX: [phone]

## 2014-07-26 ENCOUNTER — Ambulatory Visit (HOSPITAL_BASED_OUTPATIENT_CLINIC_OR_DEPARTMENT_OTHER): Payer: No Typology Code available for payment source | Admitting: Rehabilitative and Restorative Service Providers"

## 2014-07-26 DIAGNOSIS — M778 Other enthesopathies, not elsewhere classified: Secondary | ICD-10-CM

## 2014-07-26 DIAGNOSIS — M79641 Pain in right hand: Secondary | ICD-10-CM

## 2014-07-26 NOTE — Progress Notes (Signed)
HAND THERAPY note          Certification      From:  07/13/2014            Through:   08/12/14     ONSET DATE:  06/15/14  START OF CARE DATE: 07/13/2014  VISITS FROM SOC:   2/?  TOTAL UNITS USED TO DATE: n/a  Communication with insurance provider: n/a  Claim Number:  [id number]  Interpreter needed? No  Referring Provider:  Greig Castilla   Diagnosis: R hand tendonitis     PRECAUTIONS:  none  Mechanism of Injury: Over use    Pertinent Medical/Surgical History: Date of Surgery: n/a  Surgery Performed n/a   Past Medical History   Diagnosis Date   . ADJUSTMENT REACTION NOS      current hx of depression tx with Wellbutrin   . ABNORMAL PAP SMEAR-CERVIX 04/1998     secondary to cervicitis-tx and then normal   . DYSMENORRHEA      NSAIDS      Past Surgical History   Procedure Laterality Date   . Tonsillectomy & adenoidectomy <age 19  1979 &81     T & A < 12yo   . 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     Hand Dominance: right  ____________________________________________________________________    Previous Therapy: none  Prior Level of Function:   Work/Occupation:  data entry  ADL's:  independent  Home Management Tasks: independent  Mobility:  independent    Home Environment: Apartment/House:  Living with support    SUBJECTIVE:  Patient's Statement: "I still have pain in my forearm, at my fingers twitch"    OBJECTIVE MEASURES / IMPAIRMENTS:  AROM:  RIGHT   Digits:    Able to make full composite fist  right Thumb:    Able to touch tip of thumb to base of SF    Reports increased pain 4/10 with lumbrical plus position of right hand;       Edema:    Not observed    Sensation:    Reports within functional limits          TREATMENT & EDUCATION    Primary Learner: Patient  Home Exercise Program and Orthosis Wear and Care Procedures  Challenges Impacting this Teaching: None  Desire and Motivation to Learn: Engaging with education, ask questions  Post Education Response:   Demonstrates tasks independently      Intervention 1: 97110 Therapeutic Exercises   20 min  Moist heat provided before ROM for soft tissue prep.  Reviewed and performed A/PROM for extrinsic flexion tightness.  Initiated gentle eccentric strength (ext strength)           Intervention 3:  L-code: B1478  Total time: 10 min   Type of Orthotic issued :   A   Prefabricated wrist cock-up orthosis was issued by this therapist on 07/26/2014. Pt. was given verbal and written instructions for wear, care, precautions, and clinic contact information should problems arise.   Patient endorsed comfort and agreed with purpose and goal of orthosis function.      Statement of Medical Necessity / Purpose of Orthosis: Provide position of rest for  wrist due to  painful and irritated wrist tendons.     Home Exercise Program   Orthosis at all times except hygiene and exercise   PROM (prayer stretch) 3x daily  AROM wrist flex/ext every 2 hrs 5 reps  Wrist extension 2lbs 15 reps 1x daily    Preferred Learning Style   Demonstration     Time in:   1530  Total Treatment Minutes:   30  Total Timed Code Minutes:  0      verbalizing appropriate understanding of the exercises and any precautions and demonstrating the ability to don and doff the orthosis correctly    ASSESSMENT:   .Flexion tightness contributing to wrist pain and potential muscle spasms.  Wrist cock-up brace provided for position of rest to decrease work of flexors.           PLAN:  This patient will be seen for 1 therapy session every 2 weeks for 6 weeks.    Plan for next visit:  Check wrist orthosis, extensor strength      Jesusita Oka  OTR/L  [facility]  Phone: [phone]   FAX: [phone]

## 2014-08-10 ENCOUNTER — Encounter (HOSPITAL_BASED_OUTPATIENT_CLINIC_OR_DEPARTMENT_OTHER): Payer: No Typology Code available for payment source | Admitting: Rehabilitative and Restorative Service Providers"

## 2015-04-02 DIAGNOSIS — I83812 Varicose veins of left lower extremities with pain: Secondary | ICD-10-CM | POA: Insufficient documentation

## 2015-12-06 ENCOUNTER — Encounter: Payer: Managed Care, Other (non HMO) | Admitting: Gynecology

## 2015-12-20 ENCOUNTER — Ambulatory Visit (INDEPENDENT_AMBULATORY_CARE_PROVIDER_SITE_OTHER): Payer: Managed Care, Other (non HMO) | Admitting: Gynecology

## 2015-12-20 ENCOUNTER — Encounter: Payer: Self-pay | Admitting: Gynecology

## 2015-12-20 VITALS — BP 130/80 | Ht 62.0 in | Wt 325.0 lb

## 2015-12-20 DIAGNOSIS — Z01419 Encounter for gynecological examination (general) (routine) without abnormal findings: Secondary | ICD-10-CM | POA: Diagnosis not present

## 2015-12-20 NOTE — Progress Notes (Signed)
Terri Hunt [birth year] [id number]   History:    40 y.o.  for annual gyn exam who presented to the office for her annual exam. Patient was able to conceive last year the eye in vitro fertilization with donor insemination due to the fact that her husband had leukemia several years ago. She is morbidly obese has been followed by her PCP is been doing her blood work. Her flu vaccine is up-to-date she was breast-feeding until recently and has had 2 normal menstrual periods. Patient with no previous history of any abnormal Pap smears.  Past medical history,surgical history, family history and social history were all reviewed and documented in the EPIC chart.  Gynecologic History No LMP recorded. Contraception: none Last Pap: 2015 2016. Results were: normal Last mammogram: No previous study. Results were: No previous study  Obstetric History OB History  Gravida Para Term Preterm AB Living  1 1       2   SAB TAB Ectopic Multiple Live Births        1      # Outcome Date GA Lbr Len/2nd Weight Sex Delivery Anes PTL Lv  1 Para     F CS-Unspec          ROS: A ROS was performed and pertinent positives and negatives are included in the history.  GENERAL: No fevers or chills. HEENT: No change in vision, no earache, sore throat or sinus congestion. NECK: No pain or stiffness. CARDIOVASCULAR: No chest pain or pressure. No palpitations. PULMONARY: No shortness of breath, cough or wheeze. GASTROINTESTINAL: No abdominal pain, nausea, vomiting or diarrhea, melena or bright red blood per rectum. GENITOURINARY: No urinary frequency, urgency, hesitancy or dysuria. MUSCULOSKELETAL: No joint or muscle pain, no back pain, no recent trauma. DERMATOLOGIC: No rash, no itching, no lesions. ENDOCRINE: No polyuria, polydipsia, no heat or cold intolerance. No recent change in weight. HEMATOLOGICAL: No anemia or easy bruising or bleeding. NEUROLOGIC: No headache, seizures, numbness, tingling or weakness. PSYCHIATRIC: No  depression, no loss of interest in normal activity or change in sleep pattern.     Exam: chaperone present  BP 130/80   Ht 5\' 2"  (1.575 m)   Wt (!) 325 lb (147.4 kg)   BMI 59.44 kg/m   Body mass index is 59.44 kg/m.  General appearance : Well developed well nourished female. No acute distress HEENT: Eyes: no retinal hemorrhage or exudates,  Neck supple, trachea midline, no carotid bruits, no thyroidmegaly Lungs: Clear to auscultation, no rhonchi or wheezes, or rib retractions  Heart: Regular rate and rhythm, no murmurs or gallops Breast:Examined in sitting and supine position were symmetrical in appearance, no palpable masses or tenderness,  no skin retraction, no nipple inversion, no nipple discharge, no skin discoloration, no axillary or supraclavicular lymphadenopathy Abdomen: no palpable masses or tenderness, no rebound or guarding Extremities: no edema or skin discoloration or tenderness  Pelvic:  Bartholin, Urethra, Skene Glands: Within normal limits             Vagina: No gross lesions or discharge  Cervix: No gross lesions or discharge  Uterus  anteverted, normal size, shape and consistency, non-tender and mobile  Adnexa  Without masses or tenderness  Anus and perineum  normal   Rectovaginal  normal sphincter tone without palpated masses or tenderness             Hemoccult not indicated     Assessment/Plan:  40 y.o. female for annual exam with no abnormalities detected except  for morbid obesity. She's been followed by her PCP. Labs and flu vaccine up-to-date. Pap smear not indicated this year. Patient was provided with requisition to schedule her baseline mammogram.   Terrance Mass MD, 3:55 PM 12/20/2015

## 2016-01-16 DIAGNOSIS — E890 Postprocedural hypothyroidism: Secondary | ICD-10-CM | POA: Insufficient documentation

## 2016-05-11 ENCOUNTER — Encounter: Payer: Self-pay | Admitting: Gynecology

## 2016-06-17 ENCOUNTER — Encounter: Payer: Self-pay | Admitting: Gynecology

## 2016-11-26 ENCOUNTER — Ambulatory Visit (INDEPENDENT_AMBULATORY_CARE_PROVIDER_SITE_OTHER): Payer: Managed Care, Other (non HMO)

## 2016-11-26 ENCOUNTER — Encounter: Payer: Self-pay | Admitting: Podiatry

## 2016-11-26 ENCOUNTER — Other Ambulatory Visit: Payer: Self-pay | Admitting: Podiatry

## 2016-11-26 ENCOUNTER — Ambulatory Visit (INDEPENDENT_AMBULATORY_CARE_PROVIDER_SITE_OTHER): Payer: Managed Care, Other (non HMO) | Admitting: Podiatry

## 2016-11-26 VITALS — BP 133/75 | HR 73 | Resp 16 | Ht 62.5 in | Wt 350.0 lb

## 2016-11-26 DIAGNOSIS — M7750 Other enthesopathy of unspecified foot: Secondary | ICD-10-CM

## 2016-11-26 DIAGNOSIS — M779 Enthesopathy, unspecified: Secondary | ICD-10-CM

## 2016-11-26 DIAGNOSIS — M79671 Pain in right foot: Secondary | ICD-10-CM

## 2016-11-26 MED ORDER — DICLOFENAC SODIUM 75 MG PO TBEC: 75.0000 mg | DELAYED_RELEASE_TABLET | Freq: Two times a day (BID) | ORAL | 2 refills | Status: DC

## 2016-11-26 MED ORDER — TRIAMCINOLONE ACETONIDE 10 MG/ML IJ SUSP
10.0000 mg | Freq: Once | INTRAMUSCULAR | Status: AC
  Administered 2016-11-26: 10 mg

## 2016-11-26 NOTE — Progress Notes (Signed)
Subjective:    Patient ID: Terri Hunt, female   DOB: 41 y.o.   MRN: [id number]   HPI patient presents with a lot of pain in the outside of the right foot and does have significant obesity which is a significant complicating factor. Patient states that she's getting ready to go to [facility] and she is worried about the problem. Patient does not smoke currently    Review of Systems  All other systems reviewed and are negative.       Objective:  Physical Exam  Constitutional: She appears well-developed and well-nourished.  Cardiovascular: Intact distal pulses.   Pulmonary/Chest: Effort normal.  Musculoskeletal: Normal range of motion.  Neurological: She is alert.  Skin: Skin is warm.  Nursing note and vitals reviewed.  neurovascular status found to be intact muscle strength was adequate range of motion within normal limits with patient found to have quite a bit of discomfort in the right peroneal tendon at its insertion into the base of the fifth metatarsal with fluid buildup with obesity is complicating factor. Nail condition looks good     Assessment:   Tendinitis of the peroneal tendon as it inserts into the base of fifth metatarsal      Plan:   H&P x-rays reviewed condition discussed. I did careful sheath injection lateral side foot 3 Mill grams Kenalog 5 mill grams Xylocaine and extracted on ice therapy with Ace wrap and patient be seen back if symptoms persist. Also placed on diclofenac 75 mg twice a day  X-rays were negative for signs of fracture or other bony condition plantar right

## 2016-12-21 ENCOUNTER — Encounter: Payer: Self-pay | Admitting: Women's Health

## 2016-12-21 ENCOUNTER — Ambulatory Visit (INDEPENDENT_AMBULATORY_CARE_PROVIDER_SITE_OTHER): Payer: Managed Care, Other (non HMO) | Admitting: Women's Health

## 2016-12-21 ENCOUNTER — Other Ambulatory Visit: Payer: Self-pay | Admitting: Women's Health

## 2016-12-21 ENCOUNTER — Encounter: Payer: Managed Care, Other (non HMO) | Admitting: Gynecology

## 2016-12-21 VITALS — BP 132/80 | Ht 62.0 in | Wt 352.0 lb

## 2016-12-21 DIAGNOSIS — Z01419 Encounter for gynecological examination (general) (routine) without abnormal findings: Secondary | ICD-10-CM | POA: Diagnosis not present

## 2016-12-21 DIAGNOSIS — Z30013 Encounter for initial prescription of injectable contraceptive: Secondary | ICD-10-CM

## 2016-12-21 MED ORDER — HYDROCORTISONE ACE-PRAMOXINE 2.5-1 % RE CREA: 1.0000 "application " | TOPICAL_CREAM | Freq: Three times a day (TID) | RECTAL | 0 refills | Status: DC

## 2016-12-21 MED ORDER — MEDROXYPROGESTERONE ACETATE 150 MG/ML IM SUSP: 150.0000 mg | Freq: Once | INTRAMUSCULAR | 4 refills | Status: DC

## 2016-12-21 NOTE — Addendum Note (Signed)
Addended by: Lorine Bears on: 12/21/2016 10:26 AM   Modules accepted: Orders

## 2016-12-21 NOTE — Progress Notes (Signed)
Terri Hunt 02/15/1975 [id number]    History:    Presents for annual exam.  Monthly cycle/history of infertility. Husband azoospermic history of leukemia in remission. Twins that are almost 41 years old conceived by IVF, both doing well. Primary care manages labs. Normal Pap and mammogram history. Morbidly obese, lost almost 200 pounds 3 years ago with opti fast but has regained.  Past medical history, past surgical history, family history and social history were all reviewed and documented in the EPIC chart. Stay-at-home mom.  ROS:  A ROS was performed and pertinent positives and negatives are included.  Exam:  Vitals:   12/21/16 0915  BP: 132/80  Weight: (!) 352 lb (159.7 kg)  Height: 5\' 2"  (1.575 m)   Body mass index is 64.38 kg/m.   General appearance:  Normal Thyroid:  Symmetrical, normal in size, without palpable masses or nodularity. Respiratory  Auscultation:  Clear without wheezing or rhonchi Cardiovascular  Auscultation:  Regular rate, without rubs, murmurs or gallops  Edema/varicosities:  Not grossly evident Abdominal  Soft,nontender, without masses, guarding or rebound.  Liver/spleen:  No organomegaly noted  Hernia:  None appreciated  Skin  Inspection:  Grossly normal   Breasts: Examined lying and sitting.     Right: Without masses, retractions, discharge or axillary adenopathy.     Left: Without masses, retractions, discharge or axillary adenopathy. Gentitourinary   Inguinal/mons:  Normal without inguinal adenopathy  External genitalia:  Normal  BUS/Urethra/Skene's glands:  Normal  Vagina:  Normal  Cervix:  Normal  Uterus:  normal in size, shape and contour.  Midline and mobile  Adnexa/parametria:     Rt: Without masses or tenderness.   Lt: Without masses or tenderness.  Anus and perineum: Normal  Digital rectal exam: Normal sphincter tone without palpated masses or tenderness  Assessment/Plan:  41 y.o. MWF G1 P2 (twins boy and girl)  for annual exam  with no complaints.  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 SBE's, continue annual screening mammogram. Pap with HR HPV typing, new screening guidelines reviewed.Huel Cote [facility], 10:14 AM 12/21/2016

## 2016-12-21 NOTE — Patient Instructions (Addendum)
Health Maintenance, Female Adopting a healthy lifestyle and getting preventive care can go a long way to promote health and wellness. Talk with your health care provider about what schedule of regular examinations is right for you. This is a good chance for you to check in with your provider about disease prevention and staying healthy. In between checkups, there are plenty of things you can do on your own. Experts have done a lot of research about which lifestyle changes and preventive measures are most likely to keep you healthy. Ask your health care provider for more information. Weight and diet Eat a healthy diet  Be sure to include plenty of vegetables, fruits, low-fat dairy products, and lean protein.  Do not eat a lot of foods high in solid fats, added sugars, or salt.  Get regular exercise. This is one of the most important things you can do for your health. ? Most adults should exercise for at least 150 minutes each week. The exercise should increase your heart rate and make you sweat (moderate-intensity exercise). ? Most adults should also do strengthening exercises at least twice a week. This is in addition to the moderate-intensity exercise.  Maintain a healthy weight  Body mass index (BMI) is a measurement that can be used to identify possible weight problems. It estimates body fat based on height and weight. Your health care provider can help determine your BMI and help you achieve or maintain a healthy weight.  For females 20 years of age and older: ? A BMI below 18.5 is considered underweight. ? A BMI of 18.5 to 24.9 is normal. ? A BMI of 25 to 29.9 is considered overweight. ? A BMI of 30 and above is considered obese.  Watch levels of cholesterol and blood lipids  You should start having your blood tested for lipids and cholesterol at 41 years of age, then have this test every 5 years.  You may need to have your cholesterol levels checked more often if: ? Your lipid or  cholesterol levels are high. ? You are older than 41 years of age. ? You are at high risk for heart disease.  Cancer screening Lung Cancer  Lung cancer screening is recommended for adults 55-80 years old who are at high risk for lung cancer because of a history of smoking.  A yearly low-dose CT scan of the lungs is recommended for people who: ? Currently smoke. ? Have quit within the past 15 years. ? Have at least a 30-pack-year history of smoking. A pack year is smoking an average of one pack of cigarettes a day for 1 year.  Yearly screening should continue until it has been 15 years since you quit.  Yearly screening should stop if you develop a health problem that would prevent you from having lung cancer treatment.  Breast Cancer  Practice breast self-awareness. This means understanding how your breasts normally appear and feel.  It also means doing regular breast self-exams. Let your health care provider know about any changes, no matter how small.  If you are in your 20s or 30s, you should have a clinical breast exam (CBE) by a health care provider every 1-3 years as part of a regular health exam.  If you are 40 or older, have a CBE every year. Also consider having a breast X-ray (mammogram) every year.  If you have a family history of breast cancer, talk to your health care provider about genetic screening.  If you are at high risk   for breast cancer, talk to your health care provider about having an MRI and a mammogram every year.  Breast cancer gene (BRCA) assessment is recommended for women who have family members with BRCA-related cancers. BRCA-related cancers include: ? Breast. ? Ovarian. ? Tubal. ? Peritoneal cancers.  Results of the assessment will determine the need for genetic counseling and BRCA1 and BRCA2 testing.  Cervical Cancer Your health care provider may recommend that you be screened regularly for cancer of the pelvic organs (ovaries, uterus, and  vagina). This screening involves a pelvic examination, including checking for microscopic changes to the surface of your cervix (Pap test). You may be encouraged to have this screening done every 3 years, beginning at age 22.  For women ages 56-65, health care providers may recommend pelvic exams and Pap testing every 3 years, or they may recommend the Pap and pelvic exam, combined with testing for human papilloma virus (HPV), every 5 years. Some types of HPV increase your risk of cervical cancer. Testing for HPV may also be done on women of any age with unclear Pap test results.  Other health care providers may not recommend any screening for nonpregnant women who are considered low risk for pelvic cancer and who do not have symptoms. Ask your health care provider if a screening pelvic exam is right for you.  If you have had past treatment for cervical cancer or a condition that could lead to cancer, you need Pap tests and screening for cancer for at least 20 years after your treatment. If Pap tests have been discontinued, your risk factors (such as having a new sexual partner) need to be reassessed to determine if screening should resume. Some women have medical problems that increase the chance of getting cervical cancer. In these cases, your health care provider may recommend more frequent screening and Pap tests.  Colorectal Cancer  This type of cancer can be detected and often prevented.  Routine colorectal cancer screening usually begins at 41 years of age and continues through 41 years of age.  Your health care provider may recommend screening at an earlier age if you have risk factors for colon cancer.  Your health care provider may also recommend using home test kits to check for hidden blood in the stool.  A small camera at the end of a tube can be used to examine your colon directly (sigmoidoscopy or colonoscopy). This is done to check for the earliest forms of colorectal  cancer.  Routine screening usually begins at age 33.  Direct examination of the colon should be repeated every 5-10 years through 41 years of age. However, you may need to be screened more often if early forms of precancerous polyps or small growths are found.  Skin Cancer  Check your skin from head to toe regularly.  Tell your health care provider about any new moles or changes in moles, especially if there is a change in a mole's shape or color.  Also tell your health care provider if you have a mole that is larger than the size of a pencil eraser.  Always use sunscreen. Apply sunscreen liberally and repeatedly throughout the day.  Protect yourself by wearing long sleeves, pants, a wide-brimmed hat, and sunglasses whenever you are outside.  Heart disease, diabetes, and high blood pressure  High blood pressure causes heart disease and increases the risk of stroke. High blood pressure is more likely to develop in: ? People who have blood pressure in the high end of  the normal range (130-139/85-89 mm Hg). ? People who are overweight or obese. ? People who are African American.  If you are 21-29 years of age, have your blood pressure checked every 3-5 years. If you are 3 years of age or older, have your blood pressure checked every year. You should have your blood pressure measured twice-once when you are at a hospital or clinic, and once when you are not at a hospital or clinic. Record the average of the two measurements. To check your blood pressure when you are not at a hospital or clinic, you can use: ? An automated blood pressure machine at a pharmacy. ? A home blood pressure monitor.  If you are between 17 years and 37 years old, ask your health care provider if you should take aspirin to prevent strokes.  Have regular diabetes screenings. This involves taking a blood sample to check your fasting blood sugar level. ? If you are at a normal weight and have a low risk for diabetes,  have this test once every three years after 41 years of age. ? If you are overweight and have a high risk for diabetes, consider being tested at a younger age or more often. Preventing infection Hepatitis B  If you have a higher risk for hepatitis B, you should be screened for this virus. You are considered at high risk for hepatitis B if: ? You were born in a country where hepatitis B is common. Ask your health care provider which countries are considered high risk. ? Your parents were born in a high-risk country, and you have not been immunized against hepatitis B (hepatitis B vaccine). ? You have HIV or AIDS. ? You use needles to inject street drugs. ? You live with someone who has hepatitis B. ? You have had sex with someone who has hepatitis B. ? You get hemodialysis treatment. ? You take certain medicines for conditions, including cancer, organ transplantation, and autoimmune conditions.  Hepatitis C  Blood testing is recommended for: ? Everyone born from [birth year] through 1965. ? Anyone with known risk factors for hepatitis C.  Sexually transmitted infections (STIs)  You should be screened for sexually transmitted infections (STIs) including gonorrhea and chlamydia if: ? You are sexually active and are younger than 41 years of age. ? You are older than 41 years of age and your health care provider tells you that you are at risk for this type of infection. ? Your sexual activity has changed since you were last screened and you are at an increased risk for chlamydia or gonorrhea. Ask your health care provider if you are at risk.  If you do not have HIV, but are at risk, it may be recommended that you take a prescription medicine daily to prevent HIV infection. This is called pre-exposure prophylaxis (PrEP). You are considered at risk if: ? You are sexually active and do not regularly use condoms or know the HIV status of your partner(s). ? You take drugs by injection. ? You are  sexually active with a partner who has HIV.  Talk with your health care provider about whether you are at high risk of being infected with HIV. If you choose to begin PrEP, you should first be tested for HIV. You should then be tested every 3 months for as long as you are taking PrEP. Pregnancy  If you are premenopausal and you may become pregnant, ask your health care provider about preconception counseling.  If you may become  pregnant, take 400 to 800 micrograms (mcg) of folic acid every day.  If you want to prevent pregnancy, talk to your health care provider about birth control (contraception). Osteoporosis and menopause  Osteoporosis is a disease in which the bones lose minerals and strength with aging. This can result in serious bone fractures. Your risk for osteoporosis can be identified using a bone density scan.  If you are 65 years of age or older, or if you are at risk for osteoporosis and fractures, ask your health care provider if you should be screened.  Ask your health care provider whether you should take a calcium or vitamin D supplement to lower your risk for osteoporosis.  Menopause may have certain physical symptoms and risks.  Hormone replacement therapy may reduce some of these symptoms and risks. Talk to your health care provider about whether hormone replacement therapy is right for you. Follow these instructions at home:  Schedule regular health, dental, and eye exams.  Stay current with your immunizations.  Do not use any tobacco products including cigarettes, chewing tobacco, or electronic cigarettes.  If you are pregnant, do not drink alcohol.  If you are breastfeeding, limit how much and how often you drink alcohol.  Limit alcohol intake to no more than 1 drink per day for nonpregnant women. One drink equals 12 ounces of beer, 5 ounces of wine, or 1 ounces of hard liquor.  Do not use street drugs.  Do not share needles.  Ask your health care  provider for help if you need support or information about quitting drugs.  Tell your health care provider if you often feel depressed.  Tell your health care provider if you have ever been abused or do not feel safe at home. This information is not intended to replace advice given to you by your health care provider. Make sure you discuss any questions you have with your health care provider. Document Released: 08/04/2010 Document Revised: 06/27/2015 Document Reviewed: 10/23/2014 Elsevier Interactive Patient Education  2018 [employer] Carbohydrate Counting for Diabetes Mellitus, Adult Carbohydrate counting is a method for keeping track of how many carbohydrates you eat. Eating carbohydrates naturally increases the amount of sugar (glucose) in the blood. Counting how many carbohydrates you eat helps keep your blood glucose within normal limits, which helps you manage your diabetes (diabetes mellitus). It is important to know how many carbohydrates you can safely have in each meal. This is different for every person. A diet and nutrition specialist (registered dietitian) can help you make a meal plan and calculate how many carbohydrates you should have at each meal and snack. Carbohydrates are found in the following foods:  Grains, such as breads and cereals.  Dried beans and soy products.  Starchy vegetables, such as potatoes, peas, and corn.  Fruit and fruit juices.  Milk and yogurt.  Sweets and snack foods, such as cake, cookies, candy, chips, and soft drinks.  How do I count carbohydrates? There are two ways to count carbohydrates in food. You can use either of the methods or a combination of both. Reading "Nutrition Facts" on packaged food The "Nutrition Facts" list is included on the labels of almost all packaged foods and beverages in the [place] It includes:  The serving size.  Information about nutrients in each serving, including the grams (g) of carbohydrate per  serving.  To use the "Nutrition Facts":  Decide how many servings you will have.  Multiply the number of servings by the number of carbohydrates   per serving.  The resulting number is the total amount of carbohydrates that you will be having.  Learning standard serving sizes of other foods When you eat foods containing carbohydrates that are not packaged or do not include "Nutrition Facts" on the label, you need to measure the servings in order to count the amount of carbohydrates:  Measure the foods that you will eat with a food scale or measuring cup, if needed.  Decide how many standard-size servings you will eat.  Multiply the number of servings by 15. Most carbohydrate-rich foods have about 15 g of carbohydrates per serving. ? For example, if you eat 8 oz (170 g) of strawberries, you will have eaten 2 servings and 30 g of carbohydrates (2 servings x 15 g = 30 g).  For foods that have more than one food mixed, such as soups and casseroles, you must count the carbohydrates in each food that is included.  The following list contains standard serving sizes of common carbohydrate-rich foods. Each of these servings has about 15 g of carbohydrates:   hamburger bun or  English muffin.   oz (15 mL) syrup.   oz (14 g) jelly.  1 slice of bread.  1 six-inch tortilla.  3 oz (85 g) cooked rice or pasta.  4 oz (113 g) cooked dried beans.  4 oz (113 g) starchy vegetable, such as peas, corn, or potatoes.  4 oz (113 g) hot cereal.  4 oz (113 g) mashed potatoes or  of a large baked potato.  4 oz (113 g) canned or frozen fruit.  4 oz (120 mL) fruit juice.  4-6 crackers.  6 chicken nuggets.  6 oz (170 g) unsweetened dry cereal.  6 oz (170 g) plain fat-free yogurt or yogurt sweetened with artificial sweeteners.  8 oz (240 mL) milk.  8 oz (170 g) fresh fruit or one small piece of fruit.  24 oz (680 g) popped popcorn.  Example of carbohydrate counting Sample meal  3  oz (85 g) chicken breast.  6 oz (170 g) brown rice.  4 oz (113 g) corn.  8 oz (240 mL) milk.  8 oz (170 g) strawberries with sugar-free whipped topping. Carbohydrate calculation 1. Identify the foods that contain carbohydrates: ? Rice. ? Corn. ? Milk. ? Strawberries. 2. Calculate how many servings you have of each food: ? 2 servings rice. ? 1 serving corn. ? 1 serving milk. ? 1 serving strawberries. 3. Multiply each number of servings by 15 g: ? 2 servings rice x 15 g = 30 g. ? 1 serving corn x 15 g = 15 g. ? 1 serving milk x 15 g = 15 g. ? 1 serving strawberries x 15 g = 15 g. 4. Add together all of the amounts to find the total grams of carbohydrates eaten: ? 30 g + 15 g + 15 g + 15 g = 75 g of carbohydrates total. This information is not intended to replace advice given to you by your health care provider. Make sure you discuss any questions you have with your health care provider. Document Released: 01/19/2005 Document Revised: 08/09/2015 Document Reviewed: 07/03/2015 Elsevier Interactive Patient Education  [employer].

## 2016-12-22 LAB — URINALYSIS W MICROSCOPIC + REFLEX CULTURE
Bacteria, UA: NONE SEEN /HPF
Glucose, UA: NEGATIVE
Hgb urine dipstick: NEGATIVE
LEUKOCYTE ESTERASE: NEGATIVE
Nitrites, Initial: NEGATIVE
SPECIFIC GRAVITY, URINE: 1.038 — AB (ref 1.001–1.03)
pH: <=5 (ref 5.0–8.0)

## 2016-12-22 LAB — NO CULTURE INDICATED

## 2016-12-28 ENCOUNTER — Other Ambulatory Visit: Payer: Self-pay | Admitting: Women's Health

## 2016-12-28 LAB — PAP, TP IMAGING W/ HPV RNA, RFLX HPV TYPE 16,18/45: HPV DNA High Risk: NOT DETECTED

## 2016-12-28 MED ORDER — FLUCONAZOLE 150 MG PO TABS: 150.0000 mg | ORAL_TABLET | Freq: Once | ORAL | 0 refills | Status: AC

## 2017-01-04 ENCOUNTER — Ambulatory Visit (INDEPENDENT_AMBULATORY_CARE_PROVIDER_SITE_OTHER): Payer: Managed Care, Other (non HMO) | Admitting: *Deleted

## 2017-01-04 DIAGNOSIS — Z3042 Encounter for surveillance of injectable contraceptive: Secondary | ICD-10-CM

## 2017-01-04 MED ORDER — MEDROXYPROGESTERONE ACETATE 150 MG/ML IM SUSP
150.0000 mg | Freq: Once | INTRAMUSCULAR | Status: AC
  Administered 2017-01-04: 150 mg via INTRAMUSCULAR

## 2017-02-21 ENCOUNTER — Other Ambulatory Visit: Payer: Self-pay | Admitting: Podiatry

## 2017-03-09 DIAGNOSIS — M170 Bilateral primary osteoarthritis of knee: Secondary | ICD-10-CM | POA: Diagnosis not present

## 2017-03-09 DIAGNOSIS — M25561 Pain in right knee: Secondary | ICD-10-CM | POA: Diagnosis not present

## 2017-03-23 ENCOUNTER — Ambulatory Visit (INDEPENDENT_AMBULATORY_CARE_PROVIDER_SITE_OTHER): Payer: BLUE CROSS/BLUE SHIELD | Admitting: Gynecology

## 2017-03-23 DIAGNOSIS — Z3042 Encounter for surveillance of injectable contraceptive: Secondary | ICD-10-CM

## 2017-03-23 DIAGNOSIS — M170 Bilateral primary osteoarthritis of knee: Secondary | ICD-10-CM | POA: Diagnosis not present

## 2017-03-23 MED ORDER — MEDROXYPROGESTERONE ACETATE 150 MG/ML IM SUSP
150.0000 mg | Freq: Once | INTRAMUSCULAR | Status: AC
  Administered 2017-03-23: 150 mg via INTRAMUSCULAR

## 2017-03-30 DIAGNOSIS — M1711 Unilateral primary osteoarthritis, right knee: Secondary | ICD-10-CM | POA: Diagnosis not present

## 2017-03-30 DIAGNOSIS — M1712 Unilateral primary osteoarthritis, left knee: Secondary | ICD-10-CM | POA: Diagnosis not present

## 2017-04-01 DIAGNOSIS — R51 Headache: Secondary | ICD-10-CM | POA: Diagnosis not present

## 2017-04-01 DIAGNOSIS — R05 Cough: Secondary | ICD-10-CM | POA: Diagnosis not present

## 2017-04-01 DIAGNOSIS — J329 Chronic sinusitis, unspecified: Secondary | ICD-10-CM | POA: Diagnosis not present

## 2017-04-01 DIAGNOSIS — J111 Influenza due to unidentified influenza virus with other respiratory manifestations: Secondary | ICD-10-CM | POA: Diagnosis not present

## 2017-04-07 DIAGNOSIS — M170 Bilateral primary osteoarthritis of knee: Secondary | ICD-10-CM | POA: Diagnosis not present

## 2017-04-19 DIAGNOSIS — R82998 Other abnormal findings in urine: Secondary | ICD-10-CM | POA: Diagnosis not present

## 2017-04-20 DIAGNOSIS — E119 Type 2 diabetes mellitus without complications: Secondary | ICD-10-CM | POA: Diagnosis not present

## 2017-04-20 DIAGNOSIS — Z0000 Encounter for general adult medical examination without abnormal findings: Secondary | ICD-10-CM | POA: Diagnosis not present

## 2017-04-20 DIAGNOSIS — R946 Abnormal results of thyroid function studies: Secondary | ICD-10-CM | POA: Diagnosis not present

## 2017-04-26 DIAGNOSIS — R946 Abnormal results of thyroid function studies: Secondary | ICD-10-CM | POA: Diagnosis not present

## 2017-04-26 DIAGNOSIS — E119 Type 2 diabetes mellitus without complications: Secondary | ICD-10-CM | POA: Diagnosis not present

## 2017-04-26 DIAGNOSIS — Z1389 Encounter for screening for other disorder: Secondary | ICD-10-CM | POA: Diagnosis not present

## 2017-04-26 DIAGNOSIS — Z0000 Encounter for general adult medical examination without abnormal findings: Secondary | ICD-10-CM | POA: Diagnosis not present

## 2017-04-26 DIAGNOSIS — M25569 Pain in unspecified knee: Secondary | ICD-10-CM | POA: Diagnosis not present

## 2017-04-26 DIAGNOSIS — I878 Other specified disorders of veins: Secondary | ICD-10-CM | POA: Diagnosis not present

## 2017-06-08 ENCOUNTER — Ambulatory Visit (INDEPENDENT_AMBULATORY_CARE_PROVIDER_SITE_OTHER): Payer: BLUE CROSS/BLUE SHIELD | Admitting: *Deleted

## 2017-06-08 DIAGNOSIS — Z3042 Encounter for surveillance of injectable contraceptive: Secondary | ICD-10-CM | POA: Diagnosis not present

## 2017-06-08 MED ORDER — MEDROXYPROGESTERONE ACETATE 150 MG/ML IM SUSP
150.0000 mg | Freq: Once | INTRAMUSCULAR | Status: AC
  Administered 2017-06-08: 150 mg via INTRAMUSCULAR

## 2017-08-12 DIAGNOSIS — R635 Abnormal weight gain: Secondary | ICD-10-CM | POA: Diagnosis not present

## 2017-08-12 DIAGNOSIS — Z6841 Body Mass Index (BMI) 40.0 and over, adult: Secondary | ICD-10-CM | POA: Diagnosis not present

## 2017-08-12 DIAGNOSIS — R948 Abnormal results of function studies of other organs and systems: Secondary | ICD-10-CM | POA: Diagnosis not present

## 2017-08-26 ENCOUNTER — Ambulatory Visit (INDEPENDENT_AMBULATORY_CARE_PROVIDER_SITE_OTHER): Payer: BLUE CROSS/BLUE SHIELD | Admitting: Gynecology

## 2017-08-26 DIAGNOSIS — Z3042 Encounter for surveillance of injectable contraceptive: Secondary | ICD-10-CM

## 2017-08-26 DIAGNOSIS — E119 Type 2 diabetes mellitus without complications: Secondary | ICD-10-CM | POA: Diagnosis not present

## 2017-08-26 DIAGNOSIS — E8881 Metabolic syndrome: Secondary | ICD-10-CM | POA: Diagnosis not present

## 2017-08-26 DIAGNOSIS — R635 Abnormal weight gain: Secondary | ICD-10-CM | POA: Diagnosis not present

## 2017-08-26 MED ORDER — MEDROXYPROGESTERONE ACETATE 150 MG/ML IM SUSP
150.0000 mg | Freq: Once | INTRAMUSCULAR | Status: AC
  Administered 2017-08-26: 150 mg via INTRAMUSCULAR

## 2017-08-27 DIAGNOSIS — M1711 Unilateral primary osteoarthritis, right knee: Secondary | ICD-10-CM | POA: Diagnosis not present

## 2017-08-31 DIAGNOSIS — R197 Diarrhea, unspecified: Secondary | ICD-10-CM | POA: Diagnosis not present

## 2017-08-31 DIAGNOSIS — Z6841 Body Mass Index (BMI) 40.0 and over, adult: Secondary | ICD-10-CM | POA: Diagnosis not present

## 2017-08-31 DIAGNOSIS — G4733 Obstructive sleep apnea (adult) (pediatric): Secondary | ICD-10-CM | POA: Diagnosis not present

## 2017-09-07 DIAGNOSIS — E119 Type 2 diabetes mellitus without complications: Secondary | ICD-10-CM | POA: Diagnosis not present

## 2017-09-07 DIAGNOSIS — Z6841 Body Mass Index (BMI) 40.0 and over, adult: Secondary | ICD-10-CM | POA: Diagnosis not present

## 2017-09-10 DIAGNOSIS — M1712 Unilateral primary osteoarthritis, left knee: Secondary | ICD-10-CM | POA: Diagnosis not present

## 2017-09-21 DIAGNOSIS — I10 Essential (primary) hypertension: Secondary | ICD-10-CM | POA: Diagnosis not present

## 2017-09-21 DIAGNOSIS — Z6841 Body Mass Index (BMI) 40.0 and over, adult: Secondary | ICD-10-CM | POA: Diagnosis not present

## 2017-09-30 DIAGNOSIS — Z6841 Body Mass Index (BMI) 40.0 and over, adult: Secondary | ICD-10-CM | POA: Diagnosis not present

## 2017-09-30 DIAGNOSIS — R197 Diarrhea, unspecified: Secondary | ICD-10-CM | POA: Diagnosis not present

## 2017-10-14 DIAGNOSIS — G4733 Obstructive sleep apnea (adult) (pediatric): Secondary | ICD-10-CM | POA: Diagnosis not present

## 2017-10-14 DIAGNOSIS — L853 Xerosis cutis: Secondary | ICD-10-CM | POA: Diagnosis not present

## 2017-10-14 DIAGNOSIS — Z6841 Body Mass Index (BMI) 40.0 and over, adult: Secondary | ICD-10-CM | POA: Diagnosis not present

## 2017-10-14 DIAGNOSIS — R197 Diarrhea, unspecified: Secondary | ICD-10-CM | POA: Diagnosis not present

## 2017-10-14 DIAGNOSIS — I10 Essential (primary) hypertension: Secondary | ICD-10-CM | POA: Diagnosis not present

## 2017-10-26 DIAGNOSIS — E119 Type 2 diabetes mellitus without complications: Secondary | ICD-10-CM | POA: Diagnosis not present

## 2017-10-26 DIAGNOSIS — Z23 Encounter for immunization: Secondary | ICD-10-CM | POA: Diagnosis not present

## 2017-10-26 DIAGNOSIS — E7849 Other hyperlipidemia: Secondary | ICD-10-CM | POA: Diagnosis not present

## 2017-10-26 DIAGNOSIS — I10 Essential (primary) hypertension: Secondary | ICD-10-CM | POA: Diagnosis not present

## 2017-10-26 DIAGNOSIS — I878 Other specified disorders of veins: Secondary | ICD-10-CM | POA: Diagnosis not present

## 2017-10-26 DIAGNOSIS — Z1389 Encounter for screening for other disorder: Secondary | ICD-10-CM | POA: Diagnosis not present

## 2017-10-28 DIAGNOSIS — Z6841 Body Mass Index (BMI) 40.0 and over, adult: Secondary | ICD-10-CM | POA: Diagnosis not present

## 2017-10-28 DIAGNOSIS — G4733 Obstructive sleep apnea (adult) (pediatric): Secondary | ICD-10-CM | POA: Diagnosis not present

## 2017-10-28 DIAGNOSIS — E119 Type 2 diabetes mellitus without complications: Secondary | ICD-10-CM | POA: Diagnosis not present

## 2017-11-16 DIAGNOSIS — E8881 Metabolic syndrome: Secondary | ICD-10-CM | POA: Diagnosis not present

## 2017-11-16 DIAGNOSIS — Z6841 Body Mass Index (BMI) 40.0 and over, adult: Secondary | ICD-10-CM | POA: Diagnosis not present

## 2017-11-16 DIAGNOSIS — G4733 Obstructive sleep apnea (adult) (pediatric): Secondary | ICD-10-CM | POA: Diagnosis not present

## 2017-11-18 ENCOUNTER — Ambulatory Visit (INDEPENDENT_AMBULATORY_CARE_PROVIDER_SITE_OTHER): Payer: BLUE CROSS/BLUE SHIELD | Admitting: *Deleted

## 2017-11-18 DIAGNOSIS — Z3042 Encounter for surveillance of injectable contraceptive: Secondary | ICD-10-CM

## 2017-11-18 MED ORDER — MEDROXYPROGESTERONE ACETATE 150 MG/ML IM SUSP
150.0000 mg | Freq: Once | INTRAMUSCULAR | Status: AC
  Administered 2017-11-18: 150 mg via INTRAMUSCULAR

## 2017-12-02 DIAGNOSIS — Z6841 Body Mass Index (BMI) 40.0 and over, adult: Secondary | ICD-10-CM | POA: Diagnosis not present

## 2017-12-02 DIAGNOSIS — M170 Bilateral primary osteoarthritis of knee: Secondary | ICD-10-CM | POA: Diagnosis not present

## 2017-12-13 DIAGNOSIS — G4733 Obstructive sleep apnea (adult) (pediatric): Secondary | ICD-10-CM | POA: Diagnosis not present

## 2017-12-14 DIAGNOSIS — Z6841 Body Mass Index (BMI) 40.0 and over, adult: Secondary | ICD-10-CM | POA: Diagnosis not present

## 2017-12-14 DIAGNOSIS — G4733 Obstructive sleep apnea (adult) (pediatric): Secondary | ICD-10-CM | POA: Diagnosis not present

## 2017-12-22 ENCOUNTER — Encounter: Payer: Self-pay | Admitting: Women's Health

## 2017-12-22 ENCOUNTER — Ambulatory Visit (INDEPENDENT_AMBULATORY_CARE_PROVIDER_SITE_OTHER): Payer: BLUE CROSS/BLUE SHIELD | Admitting: Women's Health

## 2017-12-22 VITALS — BP 130/80 | Ht 62.5 in | Wt 297.0 lb

## 2017-12-22 DIAGNOSIS — Z01419 Encounter for gynecological examination (general) (routine) without abnormal findings: Secondary | ICD-10-CM | POA: Diagnosis not present

## 2017-12-22 DIAGNOSIS — N841 Polyp of cervix uteri: Secondary | ICD-10-CM | POA: Diagnosis not present

## 2017-12-22 MED ORDER — MEDROXYPROGESTERONE ACETATE 150 MG/ML IM SUSP: 150.0000 mg | Freq: Once | INTRAMUSCULAR | 4 refills | Status: DC

## 2017-12-22 NOTE — Progress Notes (Signed)
Terri Hunt 23-Sep-1975 [id number]    History:    Presents for annual exam.  Rare spotting on Depo-Provera for menorrhagia.  Currently doing Optifast and has lost 85 pounds.  History of diabetes and hypercholesteremia currently off medication since weight loss.  Normal Pap and mammogram history.  Past medical history, past surgical history, family history and social history were all reviewed and documented in the EPIC chart.  Twins Terri Hunt and Terri Hunt are 42 years old, conceived by IVF, husband azoospermic.  Preclampsia with pregnancy.  ROS:  A ROS was performed and pertinent positives and negatives are included.  Exam:  Vitals:   12/22/17 0942  BP: 130/80  Weight: 297 lb (134.7 kg)  Height: 5' 2.5" (1.588 m)   Body mass index is 53.46 kg/m.   General appearance:  Normal Thyroid:  Symmetrical, normal in size, without palpable masses or nodularity. Respiratory  Auscultation:  Clear without wheezing or rhonchi Cardiovascular  Auscultation:  Regular rate, without rubs, murmurs or gallops  Edema/varicosities:  Not grossly evident Abdominal  Soft,nontender, without masses, guarding or rebound.  Liver/spleen:  No organomegaly noted  Hernia:  None appreciated  Skin  Inspection:  Grossly normal   Breasts: Examined lying and sitting.     Right: Without masses, retractions, discharge or axillary adenopathy.     Left: Without masses, retractions, discharge or axillary adenopathy. Gentitourinary   Inguinal/mons:  Normal without inguinal adenopathy  External genitalia:  Normal  BUS/Urethra/Skene's glands:  Normal  Vagina:  Normal  Cervix: 1 Centimeter polyp removed intact  Uterus:  normal in size, shape and contour.  Midline and mobile  Adnexa/parametria:     Rt: Without masses or tenderness.   Lt: Without masses or tenderness.  Anus and perineum: Normal  Digital rectal exam: Normal sphincter tone without palpated masses or tenderness  Assessment/Plan:  42 y.o. MWF G1, P2 for annual  exam with no complaints.  Rare bleeding on Depo-Provera Morbid obesity on Optifast with success labs per primary care  Plan: Depo-Provera 150 q. 12 weeks prescription, proper use given and reviewed.  Endometrial polyp sent for pathology, SBE's, annual screening mammogram reviewed importance of annual, had normal 2018.  Continue Optifast, increase exercise as able has severe arthritis in knees.  Calcium rich foods, vitamin D 2000 daily encouraged.  Pap normal 2018, new screening guidelines reviewed.    Terri Hunt, 10:50 AM 12/22/2017

## 2017-12-22 NOTE — Patient Instructions (Signed)
Carbohydrate Counting for Diabetes Mellitus, Adult Carbohydrate counting is a method for keeping track of how many carbohydrates you eat. Eating carbohydrates naturally increases the amount of sugar (glucose) in the blood. Counting how many carbohydrates you eat helps keep your blood glucose within normal limits, which helps you manage your diabetes (diabetes mellitus). It is important to know how many carbohydrates you can safely have in each meal. This is different for every person. A diet and nutrition specialist (registered dietitian) can help you make a meal plan and calculate how many carbohydrates you should have at each meal and snack. Carbohydrates are found in the following foods:  Grains, such as breads and cereals.  Dried beans and soy products.  Starchy vegetables, such as potatoes, peas, and corn.  Fruit and fruit juices.  Milk and yogurt.  Sweets and snack foods, such as cake, cookies, candy, chips, and soft drinks.  How do I count carbohydrates? There are two ways to count carbohydrates in food. You can use either of the methods or a combination of both. Reading "Nutrition Facts" on packaged food The "Nutrition Facts" list is included on the labels of almost all packaged foods and beverages in the [place] It includes:  The serving size.  Information about nutrients in each serving, including the grams (g) of carbohydrate per serving.  To use the "Nutrition Facts":  Decide how many servings you will have.  Multiply the number of servings by the number of carbohydrates per serving.  The resulting number is the total amount of carbohydrates that you will be having.  Learning standard serving sizes of other foods When you eat foods containing carbohydrates that are not packaged or do not include "Nutrition Facts" on the label, you need to measure the servings in order to count the amount of carbohydrates:  Measure the foods that you will eat with a food scale or  measuring cup, if needed.  Decide how many standard-size servings you will eat.  Multiply the number of servings by 15. Most carbohydrate-rich foods have about 15 g of carbohydrates per serving. ? For example, if you eat 8 oz (170 g) of strawberries, you will have eaten 2 servings and 30 g of carbohydrates (2 servings x 15 g = 30 g).  For foods that have more than one food mixed, such as soups and casseroles, you must count the carbohydrates in each food that is included.  The following list contains standard serving sizes of common carbohydrate-rich foods. Each of these servings has about 15 g of carbohydrates:   hamburger bun or  English muffin.   oz (15 mL) syrup.   oz (14 g) jelly.  1 slice of bread.  1 six-inch tortilla.  3 oz (85 g) cooked rice or pasta.  4 oz (113 g) cooked dried beans.  4 oz (113 g) starchy vegetable, such as peas, corn, or potatoes.  4 oz (113 g) hot cereal.  4 oz (113 g) mashed potatoes or  of a large baked potato.  4 oz (113 g) canned or frozen fruit.  4 oz (120 mL) fruit juice.  4-6 crackers.  6 chicken nuggets.  6 oz (170 g) unsweetened dry cereal.  6 oz (170 g) plain fat-free yogurt or yogurt sweetened with artificial sweeteners.  8 oz (240 mL) milk.  8 oz (170 g) fresh fruit or one small piece of fruit.  24 oz (680 g) popped popcorn.  Example of carbohydrate counting Sample meal  3 oz (85 g) chicken breast.    6 oz (170 g) brown rice.  4 oz (113 g) corn.  8 oz (240 mL) milk.  8 oz (170 g) strawberries with sugar-free whipped topping. Carbohydrate calculation 1. Identify the foods that contain carbohydrates: ? Rice. ? Corn. ? Milk. ? Strawberries. 2. Calculate how many servings you have of each food: ? 2 servings rice. ? 1 serving corn. ? 1 serving milk. ? 1 serving strawberries. 3. Multiply each number of servings by 15 g: ? 2 servings rice x 15 g = 30 g. ? 1 serving corn x 15 g = 15 g. ? 1 serving milk x 15  g = 15 g. ? 1 serving strawberries x 15 g = 15 g. 4. Add together all of the amounts to find the total grams of carbohydrates eaten: ? 30 g + 15 g + 15 g + 15 g = 75 g of carbohydrates total. This information is not intended to replace advice given to you by your health care provider. Make sure you discuss any questions you have with your health care provider. Document Released: 01/19/2005 Document Revised: 08/09/2015 Document Reviewed: 07/03/2015 Elsevier Interactive Patient Education  2018 [place] Maintenance, Female Adopting a healthy lifestyle and getting preventive care can go a long way to promote health and wellness. Talk with your health care provider about what schedule of regular examinations is right for you. This is a good chance for you to check in with your provider about disease prevention and staying healthy. In between checkups, there are plenty of things you can do on your own. Experts have done a lot of research about which lifestyle changes and preventive measures are most likely to keep you healthy. Ask your health care provider for more information. Weight and diet Eat a healthy diet  Be sure to include plenty of vegetables, fruits, low-fat dairy products, and lean protein.  Do not eat a lot of foods high in solid fats, added sugars, or salt.  Get regular exercise. This is one of the most important things you can do for your health. ? Most adults should exercise for at least 150 minutes each week. The exercise should increase your heart rate and make you sweat (moderate-intensity exercise). ? Most adults should also do strengthening exercises at least twice a week. This is in addition to the moderate-intensity exercise.  Maintain a healthy weight  Body mass index (BMI) is a measurement that can be used to identify possible weight problems. It estimates body fat based on height and weight. Your health care provider can help determine your BMI and help you  achieve or maintain a healthy weight.  For females 42 years of age and older: ? A BMI below 18.5 is considered underweight. ? A BMI of 18.5 to 24.9 is normal. ? A BMI of 25 to 29.9 is considered overweight. ? A BMI of 30 and above is considered obese.  Watch levels of cholesterol and blood lipids  You should start having your blood tested for lipids and cholesterol at 42 years of age, then have this test every 5 years.  You may need to have your cholesterol levels checked more often if: ? Your lipid or cholesterol levels are high. ? You are older than 42 years of age. ? You are at high risk for heart disease.  Cancer screening Lung Cancer  Lung cancer screening is recommended for adults 52-25 years old who are at high risk for lung cancer because of a history of smoking.  A  yearly low-dose CT scan of the lungs is recommended for people who: ? Currently smoke. ? Have quit within the past 15 years. ? Have at least a 30-pack-year history of smoking. A pack year is smoking an average of one pack of cigarettes a day for 1 year.  Yearly screening should continue until it has been 15 years since you quit.  Yearly screening should stop if you develop a health problem that would prevent you from having lung cancer treatment.  Breast Cancer  Practice breast self-awareness. This means understanding how your breasts normally appear and feel.  It also means doing regular breast self-exams. Let your health care provider know about any changes, no matter how small.  If you are in your 20s or 30s, you should have a clinical breast exam (CBE) by a health care provider every 1-3 years as part of a regular health exam.  If you are 4 or older, have a CBE every year. Also consider having a breast X-ray (mammogram) every year.  If you have a family history of breast cancer, talk to your health care provider about genetic screening.  If you are at high risk for breast cancer, talk to your health  care provider about having an MRI and a mammogram every year.  Breast cancer gene (BRCA) assessment is recommended for women who have family members with BRCA-related cancers. BRCA-related cancers include: ? Breast. ? Ovarian. ? Tubal. ? Peritoneal cancers.  Results of the assessment will determine the need for genetic counseling and BRCA1 and BRCA2 testing.  Cervical Cancer Your health care provider may recommend that you be screened regularly for cancer of the pelvic organs (ovaries, uterus, and vagina). This screening involves a pelvic examination, including checking for microscopic changes to the surface of your cervix (Pap test). You may be encouraged to have this screening done every 3 years, beginning at age 12.  For women ages 13-65, health care providers may recommend pelvic exams and Pap testing every 3 years, or they may recommend the Pap and pelvic exam, combined with testing for human papilloma virus (HPV), every 5 years. Some types of HPV increase your risk of cervical cancer. Testing for HPV may also be done on women of any age with unclear Pap test results.  Other health care providers may not recommend any screening for nonpregnant women who are considered low risk for pelvic cancer and who do not have symptoms. Ask your health care provider if a screening pelvic exam is right for you.  If you have had past treatment for cervical cancer or a condition that could lead to cancer, you need Pap tests and screening for cancer for at least 20 years after your treatment. If Pap tests have been discontinued, your risk factors (such as having a new sexual partner) need to be reassessed to determine if screening should resume. Some women have medical problems that increase the chance of getting cervical cancer. In these cases, your health care provider may recommend more frequent screening and Pap tests.  Colorectal Cancer  This type of cancer can be detected and often  prevented.  Routine colorectal cancer screening usually begins at 42 years of age and continues through 42 years of age.  Your health care provider may recommend screening at an earlier age if you have risk factors for colon cancer.  Your health care provider may also recommend using home test kits to check for hidden blood in the stool.  A small camera at the end of  a tube can be used to examine your colon directly (sigmoidoscopy or colonoscopy). This is done to check for the earliest forms of colorectal cancer.  Routine screening usually begins at age 45.  Direct examination of the colon should be repeated every 5-10 years through 42 years of age. However, you may need to be screened more often if early forms of precancerous polyps or small growths are found.  Skin Cancer  Check your skin from head to toe regularly.  Tell your health care provider about any new moles or changes in moles, especially if there is a change in a mole's shape or color.  Also tell your health care provider if you have a mole that is larger than the size of a pencil eraser.  Always use sunscreen. Apply sunscreen liberally and repeatedly throughout the day.  Protect yourself by wearing long sleeves, pants, a wide-brimmed hat, and sunglasses whenever you are outside.  Heart disease, diabetes, and high blood pressure  High blood pressure causes heart disease and increases the risk of stroke. High blood pressure is more likely to develop in: ? People who have blood pressure in the high end of the normal range (130-139/85-89 mm Hg). ? People who are overweight or obese. ? People who are African American.  If you are 65-44 years of age, have your blood pressure checked every 3-5 years. If you are 35 years of age or older, have your blood pressure checked every year. You should have your blood pressure measured twice-once when you are at a hospital or clinic, and once when you are not at a hospital or clinic.  Record the average of the two measurements. To check your blood pressure when you are not at a hospital or clinic, you can use: ? An automated blood pressure machine at a pharmacy. ? A home blood pressure monitor.  If you are between 55 years and 4 years old, ask your health care provider if you should take aspirin to prevent strokes.  Have regular diabetes screenings. This involves taking a blood sample to check your fasting blood sugar level. ? If you are at a normal weight and have a low risk for diabetes, have this test once every three years after 42 years of age. ? If you are overweight and have a high risk for diabetes, consider being tested at a younger age or more often. Preventing infection Hepatitis B  If you have a higher risk for hepatitis B, you should be screened for this virus. You are considered at high risk for hepatitis B if: ? You were born in a country where hepatitis B is common. Ask your health care provider which countries are considered high risk. ? Your parents were born in a high-risk country, and you have not been immunized against hepatitis B (hepatitis B vaccine). ? You have HIV or AIDS. ? You use needles to inject street drugs. ? You live with someone who has hepatitis B. ? You have had sex with someone who has hepatitis B. ? You get hemodialysis treatment. ? You take certain medicines for conditions, including cancer, organ transplantation, and autoimmune conditions.  Hepatitis C  Blood testing is recommended for: ? Everyone born from [birth year] through 1965. ? Anyone with known risk factors for hepatitis C.  Sexually transmitted infections (STIs)  You should be screened for sexually transmitted infections (STIs) including gonorrhea and chlamydia if: ? You are sexually active and are younger than 42 years of age. ? You are older than  42 years of age and your health care provider tells you that you are at risk for this type of infection. ? Your sexual  activity has changed since you were last screened and you are at an increased risk for chlamydia or gonorrhea. Ask your health care provider if you are at risk.  If you do not have HIV, but are at risk, it may be recommended that you take a prescription medicine daily to prevent HIV infection. This is called pre-exposure prophylaxis (PrEP). You are considered at risk if: ? You are sexually active and do not regularly use condoms or know the HIV status of your partner(s). ? You take drugs by injection. ? You are sexually active with a partner who has HIV.  Talk with your health care provider about whether you are at high risk of being infected with HIV. If you choose to begin PrEP, you should first be tested for HIV. You should then be tested every 3 months for as long as you are taking PrEP. Pregnancy  If you are premenopausal and you may become pregnant, ask your health care provider about preconception counseling.  If you may become pregnant, take 400 to 800 micrograms (mcg) of folic acid every day.  If you want to prevent pregnancy, talk to your health care provider about birth control (contraception). Osteoporosis and menopause  Osteoporosis is a disease in which the bones lose minerals and strength with aging. This can result in serious bone fractures. Your risk for osteoporosis can be identified using a bone density scan.  If you are 29 years of age or older, or if you are at risk for osteoporosis and fractures, ask your health care provider if you should be screened.  Ask your health care provider whether you should take a calcium or vitamin D supplement to lower your risk for osteoporosis.  Menopause may have certain physical symptoms and risks.  Hormone replacement therapy may reduce some of these symptoms and risks. Talk to your health care provider about whether hormone replacement therapy is right for you. Follow these instructions at home:  Schedule regular health, dental,  and eye exams.  Stay current with your immunizations.  Do not use any tobacco products including cigarettes, chewing tobacco, or electronic cigarettes.  If you are pregnant, do not drink alcohol.  If you are breastfeeding, limit how much and how often you drink alcohol.  Limit alcohol intake to no more than 1 drink per day for nonpregnant women. One drink equals 12 ounces of beer, 5 ounces of wine, or 1 ounces of hard liquor.  Do not use street drugs.  Do not share needles.  Ask your health care provider for help if you need support or information about quitting drugs.  Tell your health care provider if you often feel depressed.  Tell your health care provider if you have ever been abused or do not feel safe at home. This information is not intended to replace advice given to you by your health care provider. Make sure you discuss any questions you have with your health care provider. Document Released: 08/04/2010 Document Revised: 06/27/2015 Document Reviewed: 10/23/2014 Elsevier Interactive Patient Education  [employer].

## 2017-12-23 DIAGNOSIS — Z6841 Body Mass Index (BMI) 40.0 and over, adult: Secondary | ICD-10-CM | POA: Diagnosis not present

## 2017-12-24 LAB — PATHOLOGY

## 2017-12-24 LAB — TISSUE SPECIMEN

## 2017-12-28 DIAGNOSIS — Z6841 Body Mass Index (BMI) 40.0 and over, adult: Secondary | ICD-10-CM | POA: Diagnosis not present

## 2017-12-28 DIAGNOSIS — E669 Obesity, unspecified: Secondary | ICD-10-CM | POA: Diagnosis not present

## 2018-01-06 DIAGNOSIS — M25561 Pain in right knee: Secondary | ICD-10-CM | POA: Diagnosis not present

## 2018-01-06 DIAGNOSIS — M25562 Pain in left knee: Secondary | ICD-10-CM | POA: Diagnosis not present

## 2018-01-11 DIAGNOSIS — R7303 Prediabetes: Secondary | ICD-10-CM | POA: Diagnosis not present

## 2018-01-11 DIAGNOSIS — E782 Mixed hyperlipidemia: Secondary | ICD-10-CM | POA: Diagnosis not present

## 2018-01-11 DIAGNOSIS — Z6841 Body Mass Index (BMI) 40.0 and over, adult: Secondary | ICD-10-CM | POA: Diagnosis not present

## 2018-01-14 ENCOUNTER — Encounter: Payer: Self-pay | Admitting: Women's Health

## 2018-01-14 DIAGNOSIS — Z9989 Dependence on other enabling machines and devices: Secondary | ICD-10-CM | POA: Diagnosis not present

## 2018-01-14 DIAGNOSIS — G4733 Obstructive sleep apnea (adult) (pediatric): Secondary | ICD-10-CM | POA: Diagnosis not present

## 2018-01-14 DIAGNOSIS — Z1231 Encounter for screening mammogram for malignant neoplasm of breast: Secondary | ICD-10-CM | POA: Diagnosis not present

## 2018-02-10 ENCOUNTER — Ambulatory Visit (INDEPENDENT_AMBULATORY_CARE_PROVIDER_SITE_OTHER): Payer: BLUE CROSS/BLUE SHIELD | Admitting: *Deleted

## 2018-02-10 DIAGNOSIS — Z3042 Encounter for surveillance of injectable contraceptive: Secondary | ICD-10-CM

## 2018-02-10 MED ORDER — MEDROXYPROGESTERONE ACETATE 150 MG/ML IM SUSP
150.0000 mg | Freq: Once | INTRAMUSCULAR | Status: AC
  Administered 2018-02-10: 150 mg via INTRAMUSCULAR

## 2018-03-15 DIAGNOSIS — G4733 Obstructive sleep apnea (adult) (pediatric): Secondary | ICD-10-CM | POA: Diagnosis not present

## 2018-03-22 DIAGNOSIS — Z713 Dietary counseling and surveillance: Secondary | ICD-10-CM | POA: Diagnosis not present

## 2018-03-22 DIAGNOSIS — Z6841 Body Mass Index (BMI) 40.0 and over, adult: Secondary | ICD-10-CM | POA: Diagnosis not present

## 2018-03-29 DIAGNOSIS — Z6841 Body Mass Index (BMI) 40.0 and over, adult: Secondary | ICD-10-CM | POA: Diagnosis not present

## 2018-04-05 DIAGNOSIS — Z713 Dietary counseling and surveillance: Secondary | ICD-10-CM | POA: Diagnosis not present

## 2018-04-05 DIAGNOSIS — Z6841 Body Mass Index (BMI) 40.0 and over, adult: Secondary | ICD-10-CM | POA: Diagnosis not present

## 2018-04-13 DIAGNOSIS — G4733 Obstructive sleep apnea (adult) (pediatric): Secondary | ICD-10-CM | POA: Diagnosis not present

## 2018-04-25 DIAGNOSIS — G4733 Obstructive sleep apnea (adult) (pediatric): Secondary | ICD-10-CM | POA: Diagnosis not present

## 2018-04-28 ENCOUNTER — Encounter: Payer: Self-pay | Admitting: Women's Health

## 2018-04-28 NOTE — Telephone Encounter (Signed)
Nancy Please Advise. Sharrie Rothman CMA

## 2018-05-03 ENCOUNTER — Ambulatory Visit: Payer: BLUE CROSS/BLUE SHIELD

## 2018-05-14 ENCOUNTER — Encounter: Payer: Self-pay | Admitting: Women's Health

## 2018-05-14 DIAGNOSIS — G4733 Obstructive sleep apnea (adult) (pediatric): Secondary | ICD-10-CM | POA: Diagnosis not present

## 2018-05-24 DIAGNOSIS — M25562 Pain in left knee: Secondary | ICD-10-CM | POA: Diagnosis not present

## 2018-05-24 DIAGNOSIS — G8929 Other chronic pain: Secondary | ICD-10-CM | POA: Diagnosis not present

## 2018-05-24 DIAGNOSIS — M25561 Pain in right knee: Secondary | ICD-10-CM | POA: Diagnosis not present

## 2018-05-25 DIAGNOSIS — M25562 Pain in left knee: Secondary | ICD-10-CM | POA: Diagnosis not present

## 2018-05-25 DIAGNOSIS — M25561 Pain in right knee: Secondary | ICD-10-CM | POA: Diagnosis not present

## 2018-06-02 DIAGNOSIS — Z713 Dietary counseling and surveillance: Secondary | ICD-10-CM | POA: Diagnosis not present

## 2018-06-13 DIAGNOSIS — G4733 Obstructive sleep apnea (adult) (pediatric): Secondary | ICD-10-CM | POA: Diagnosis not present

## 2018-06-21 DIAGNOSIS — M25562 Pain in left knee: Secondary | ICD-10-CM | POA: Diagnosis not present

## 2018-06-21 DIAGNOSIS — G8929 Other chronic pain: Secondary | ICD-10-CM | POA: Diagnosis not present

## 2018-06-21 DIAGNOSIS — M25561 Pain in right knee: Secondary | ICD-10-CM | POA: Diagnosis not present

## 2018-07-05 DIAGNOSIS — Z713 Dietary counseling and surveillance: Secondary | ICD-10-CM | POA: Diagnosis not present

## 2018-07-14 DIAGNOSIS — G4733 Obstructive sleep apnea (adult) (pediatric): Secondary | ICD-10-CM | POA: Diagnosis not present

## 2018-08-08 ENCOUNTER — Encounter: Payer: Self-pay | Admitting: Women's Health

## 2018-08-13 DIAGNOSIS — G4733 Obstructive sleep apnea (adult) (pediatric): Secondary | ICD-10-CM | POA: Diagnosis not present

## 2018-08-18 DIAGNOSIS — G8929 Other chronic pain: Secondary | ICD-10-CM | POA: Diagnosis not present

## 2018-08-18 DIAGNOSIS — M25561 Pain in right knee: Secondary | ICD-10-CM | POA: Diagnosis not present

## 2018-08-18 DIAGNOSIS — M25562 Pain in left knee: Secondary | ICD-10-CM | POA: Diagnosis not present

## 2018-09-01 DIAGNOSIS — Z713 Dietary counseling and surveillance: Secondary | ICD-10-CM | POA: Diagnosis not present

## 2018-09-13 DIAGNOSIS — G4733 Obstructive sleep apnea (adult) (pediatric): Secondary | ICD-10-CM | POA: Diagnosis not present

## 2018-09-22 DIAGNOSIS — M25562 Pain in left knee: Secondary | ICD-10-CM | POA: Diagnosis not present

## 2018-09-22 DIAGNOSIS — M25561 Pain in right knee: Secondary | ICD-10-CM | POA: Diagnosis not present

## 2018-10-13 DIAGNOSIS — E7849 Other hyperlipidemia: Secondary | ICD-10-CM | POA: Diagnosis not present

## 2018-10-13 DIAGNOSIS — E119 Type 2 diabetes mellitus without complications: Secondary | ICD-10-CM | POA: Diagnosis not present

## 2018-10-13 DIAGNOSIS — Z0000 Encounter for general adult medical examination without abnormal findings: Secondary | ICD-10-CM | POA: Diagnosis not present

## 2018-10-17 DIAGNOSIS — R946 Abnormal results of thyroid function studies: Secondary | ICD-10-CM | POA: Diagnosis not present

## 2018-10-17 DIAGNOSIS — J309 Allergic rhinitis, unspecified: Secondary | ICD-10-CM | POA: Diagnosis not present

## 2018-10-17 DIAGNOSIS — Z0000 Encounter for general adult medical examination without abnormal findings: Secondary | ICD-10-CM | POA: Diagnosis not present

## 2018-10-17 DIAGNOSIS — M25569 Pain in unspecified knee: Secondary | ICD-10-CM | POA: Diagnosis not present

## 2018-10-18 ENCOUNTER — Other Ambulatory Visit: Payer: Self-pay | Admitting: Internal Medicine

## 2018-10-18 DIAGNOSIS — E785 Hyperlipidemia, unspecified: Secondary | ICD-10-CM

## 2018-10-28 DIAGNOSIS — M25561 Pain in right knee: Secondary | ICD-10-CM | POA: Diagnosis not present

## 2018-10-28 DIAGNOSIS — Z9989 Dependence on other enabling machines and devices: Secondary | ICD-10-CM | POA: Diagnosis not present

## 2018-10-28 DIAGNOSIS — M25562 Pain in left knee: Secondary | ICD-10-CM | POA: Diagnosis not present

## 2018-10-28 DIAGNOSIS — G4733 Obstructive sleep apnea (adult) (pediatric): Secondary | ICD-10-CM | POA: Diagnosis not present

## 2018-11-11 ENCOUNTER — Ambulatory Visit
Admission: RE | Admit: 2018-11-11 | Discharge: 2018-11-11 | Disposition: A | Discharge status: Home / Self Care | Payer: No Typology Code available for payment source | Source: Ambulatory Visit | Attending: Internal Medicine | Admitting: Internal Medicine

## 2018-11-11 DIAGNOSIS — E785 Hyperlipidemia, unspecified: Secondary | ICD-10-CM

## 2018-11-14 ENCOUNTER — Encounter: Payer: Self-pay | Admitting: Women's Health

## 2018-11-15 DIAGNOSIS — M25561 Pain in right knee: Secondary | ICD-10-CM | POA: Diagnosis not present

## 2018-11-18 DIAGNOSIS — Z713 Dietary counseling and surveillance: Secondary | ICD-10-CM | POA: Diagnosis not present

## 2018-12-01 DIAGNOSIS — Z9989 Dependence on other enabling machines and devices: Secondary | ICD-10-CM | POA: Diagnosis not present

## 2018-12-01 DIAGNOSIS — G4733 Obstructive sleep apnea (adult) (pediatric): Secondary | ICD-10-CM | POA: Diagnosis not present

## 2018-12-03 LAB — OTHER LAB ORDER
Hemoglobin A1C: 5.3 % (ref 4.8–5.6)
Mean Blood Glucose Estimate: 105 mg/dL

## 2018-12-16 DIAGNOSIS — Z713 Dietary counseling and surveillance: Secondary | ICD-10-CM | POA: Diagnosis not present

## 2018-12-23 ENCOUNTER — Other Ambulatory Visit: Payer: Self-pay

## 2018-12-26 ENCOUNTER — Ambulatory Visit (INDEPENDENT_AMBULATORY_CARE_PROVIDER_SITE_OTHER): Payer: BC Managed Care – PPO | Admitting: Women's Health

## 2018-12-26 ENCOUNTER — Encounter: Payer: Self-pay | Admitting: Women's Health

## 2018-12-26 ENCOUNTER — Other Ambulatory Visit: Payer: Self-pay

## 2018-12-26 VITALS — BP 138/80 | Ht 62.0 in | Wt 362.0 lb

## 2018-12-26 DIAGNOSIS — Z01419 Encounter for gynecological examination (general) (routine) without abnormal findings: Secondary | ICD-10-CM | POA: Diagnosis not present

## 2018-12-26 DIAGNOSIS — Z23 Encounter for immunization: Secondary | ICD-10-CM | POA: Diagnosis not present

## 2018-12-26 MED ORDER — MEDROXYPROGESTERONE ACETATE 150 MG/ML IM SUSP: 150.0000 mg | Freq: Once | INTRAMUSCULAR | 4 refills | Status: DC

## 2018-12-26 NOTE — Progress Notes (Signed)
Terri Hunt 1975-11-10 [id number]    History:    Presents for annual exam.  Amenorrheic on Depo-Provera.  2019 had lost 85 pounds with Optifast is still down 10 pounds but weight is back up to 362.  Normal Pap and mammogram history.  Medical problems include GERD, occasional hemorrhoids and asthma.  43-year-old twins doing well, both have eczema son more so than daughter.  Past medical history, past surgical history, family history and social history were all reviewed and documented in the EPIC chart.  ROS:  A ROS was performed and pertinent positives and negatives are included.  Exam:  Vitals:   12/26/18 1103  BP: 138/80  Weight: (!) 362 lb (164.2 kg)  Height: 5\' 2"  (1.575 m)   Body mass index is 66.21 kg/m.   General appearance:  Normal Thyroid:  Symmetrical, normal in size, without palpable masses or nodularity. Respiratory  Auscultation:  Clear without wheezing or rhonchi Cardiovascular  Auscultation:  Regular rate, without rubs, murmurs or gallops  Edema/varicosities:  Not grossly evident Abdominal  Soft,nontender, without masses, guarding or rebound.  Liver/spleen:  No organomegaly noted  Hernia:  None appreciated  Skin  Inspection:  Grossly normal   Breasts: Examined lying and sitting.     Right: Without masses, retractions, discharge or axillary adenopathy.     Left: Without masses, retractions, discharge or axillary adenopathy. Gentitourinary   Inguinal/mons:  Normal without inguinal adenopathy  External genitalia:  Normal  BUS/Urethra/Skene's glands:  Normal  Vagina:  Normal  Cervix:  Normal  Uterus:  normal in size, shape and contour.  Midline and mobile  Adnexa/parametria:     Rt: Without masses or tenderness.   Lt: Without masses or tenderness.  Anus and perineum: Normal  Digital rectal exam: Normal sphincter tone without palpated masses or tenderness  Assessment/Plan:  43 y.o. MWF G1, P2 for annual exam with no complaints.  Amenorrheic on  Depo-Provera Morbid obesity - chronic knee pain Asthma-primary care manages labs and meds  Plan: Depo-Provera 150 every 12 weeks prescription, proper use given and reviewed.  Aware of need to increase exercise and decrease calorie/carbs for weight loss.  Bariatric surgery reviewed, declines.  SBEs, annual screening mammogram, calcium rich foods, vitamin D 1000 daily encouraged.  Pap normal 2018, new screening guidelines reviewed.    [place], 11:19 AM 12/26/2018

## 2018-12-26 NOTE — Patient Instructions (Addendum)
Vit d 1000 iu daily  Health Maintenance, Female Adopting a healthy lifestyle and getting preventive care are important in promoting health and wellness. Ask your health care provider about:  The right schedule for you to have regular tests and exams.  Things you can do on your own to prevent diseases and keep yourself healthy. What should I know about diet, weight, and exercise? Eat a healthy diet   Eat a diet that includes plenty of vegetables, fruits, low-fat dairy products, and lean protein.  Do not eat a lot of foods that are high in solid fats, added sugars, or sodium. Maintain a healthy weight Body mass index (BMI) is used to identify weight problems. It estimates body fat based on height and weight. Your health care provider can help determine your BMI and help you achieve or maintain a healthy weight. Get regular exercise Get regular exercise. This is one of the most important things you can do for your health. Most adults should:  Exercise for at least 150 minutes each week. The exercise should increase your heart rate and make you sweat (moderate-intensity exercise).  Do strengthening exercises at least twice a week. This is in addition to the moderate-intensity exercise.  Spend less time sitting. Even light physical activity can be beneficial. Watch cholesterol and blood lipids Have your blood tested for lipids and cholesterol at 43 years of age, then have this test every 5 years. Have your cholesterol levels checked more often if:  Your lipid or cholesterol levels are high.  You are older than 43 years of age.  You are at high risk for heart disease. What should I know about cancer screening? Depending on your health history and family history, you may need to have cancer screening at various ages. This may include screening for:  Breast cancer.  Cervical cancer.  Colorectal cancer.  Skin cancer.  Lung cancer. What should I know about heart disease, diabetes,  and high blood pressure? Blood pressure and heart disease  High blood pressure causes heart disease and increases the risk of stroke. This is more likely to develop in people who have high blood pressure readings, are of African descent, or are overweight.  Have your blood pressure checked: ? Every 3-5 years if you are 18-39 years of age. ? Every year if you are 40 years old or older. Diabetes Have regular diabetes screenings. This checks your fasting blood sugar level. Have the screening done:  Once every three years after age 40 if you are at a normal weight and have a low risk for diabetes.  More often and at a younger age if you are overweight or have a high risk for diabetes. What should I know about preventing infection? Hepatitis B If you have a higher risk for hepatitis B, you should be screened for this virus. Talk with your health care provider to find out if you are at risk for hepatitis B infection. Hepatitis C Testing is recommended for:  Everyone born from 1945 through 1965.  Anyone with known risk factors for hepatitis C. Sexually transmitted infections (STIs)  Get screened for STIs, including gonorrhea and chlamydia, if: ? You are sexually active and are younger than 43 years of age. ? You are older than 43 years of age and your health care provider tells you that you are at risk for this type of infection. ? Your sexual activity has changed since you were last screened, and you are at increased risk for chlamydia or gonorrhea.   Ask your health care provider if you are at risk.  Ask your health care provider about whether you are at high risk for HIV. Your health care provider may recommend a prescription medicine to help prevent HIV infection. If you choose to take medicine to prevent HIV, you should first get tested for HIV. You should then be tested every 3 months for as long as you are taking the medicine. Pregnancy  If you are about to stop having your period  (premenopausal) and you may become pregnant, seek counseling before you get pregnant.  Take 400 to 800 micrograms (mcg) of folic acid every day if you become pregnant.  Ask for birth control (contraception) if you want to prevent pregnancy. Osteoporosis and menopause Osteoporosis is a disease in which the bones lose minerals and strength with aging. This can result in bone fractures. If you are 65 years old or older, or if you are at risk for osteoporosis and fractures, ask your health care provider if you should:  Be screened for bone loss.  Take a calcium or vitamin D supplement to lower your risk of fractures.  Be given hormone replacement therapy (HRT) to treat symptoms of menopause. Follow these instructions at home: Lifestyle  Do not use any products that contain nicotine or tobacco, such as cigarettes, e-cigarettes, and chewing tobacco. If you need help quitting, ask your health care provider.  Do not use street drugs.  Do not share needles.  Ask your health care provider for help if you need support or information about quitting drugs. Alcohol use  Do not drink alcohol if: ? Your health care provider tells you not to drink. ? You are pregnant, may be pregnant, or are planning to become pregnant.  If you drink alcohol: ? Limit how much you use to 0-1 drink a day. ? Limit intake if you are breastfeeding.  Be aware of how much alcohol is in your drink. In the [place] one drink equals one 12 oz bottle of beer (355 mL), one 5 oz glass of wine (148 mL), or one 1 oz glass of hard liquor (44 mL). General instructions  Schedule regular health, dental, and eye exams.  Stay current with your vaccines.  Tell your health care provider if: ? You often feel depressed. ? You have ever been abused or do not feel safe at home. Summary  Adopting a healthy lifestyle and getting preventive care are important in promoting health and wellness.  Follow your health care provider's  instructions about healthy diet, exercising, and getting tested or screened for diseases.  Follow your health care provider's instructions on monitoring your cholesterol and blood pressure. This information is not intended to replace advice given to you by your health care provider. Make sure you discuss any questions you have with your health care provider. Document Released: 08/04/2010 Document Revised: 01/12/2018 Document Reviewed: 01/12/2018 Elsevier Patient Education  2020 Elsevier Inc.  

## 2019-01-02 DIAGNOSIS — R29898 Other symptoms and signs involving the musculoskeletal system: Secondary | ICD-10-CM | POA: Diagnosis not present

## 2019-01-02 DIAGNOSIS — M25661 Stiffness of right knee, not elsewhere classified: Secondary | ICD-10-CM | POA: Diagnosis not present

## 2019-01-02 DIAGNOSIS — M25561 Pain in right knee: Secondary | ICD-10-CM | POA: Diagnosis not present

## 2019-01-04 DIAGNOSIS — M25661 Stiffness of right knee, not elsewhere classified: Secondary | ICD-10-CM | POA: Diagnosis not present

## 2019-01-04 DIAGNOSIS — M25561 Pain in right knee: Secondary | ICD-10-CM | POA: Diagnosis not present

## 2019-01-04 DIAGNOSIS — R29898 Other symptoms and signs involving the musculoskeletal system: Secondary | ICD-10-CM | POA: Diagnosis not present

## 2019-01-10 DIAGNOSIS — R29898 Other symptoms and signs involving the musculoskeletal system: Secondary | ICD-10-CM | POA: Diagnosis not present

## 2019-01-10 DIAGNOSIS — R7303 Prediabetes: Secondary | ICD-10-CM | POA: Diagnosis not present

## 2019-01-10 DIAGNOSIS — M25561 Pain in right knee: Secondary | ICD-10-CM | POA: Diagnosis not present

## 2019-01-10 DIAGNOSIS — M25562 Pain in left knee: Secondary | ICD-10-CM | POA: Diagnosis not present

## 2019-01-10 DIAGNOSIS — M25661 Stiffness of right knee, not elsewhere classified: Secondary | ICD-10-CM | POA: Diagnosis not present

## 2019-01-12 DIAGNOSIS — M25562 Pain in left knee: Secondary | ICD-10-CM | POA: Diagnosis not present

## 2019-01-12 DIAGNOSIS — M25661 Stiffness of right knee, not elsewhere classified: Secondary | ICD-10-CM | POA: Diagnosis not present

## 2019-01-12 DIAGNOSIS — R29898 Other symptoms and signs involving the musculoskeletal system: Secondary | ICD-10-CM | POA: Diagnosis not present

## 2019-01-12 DIAGNOSIS — M25561 Pain in right knee: Secondary | ICD-10-CM | POA: Diagnosis not present

## 2019-01-16 DIAGNOSIS — Z1231 Encounter for screening mammogram for malignant neoplasm of breast: Secondary | ICD-10-CM | POA: Diagnosis not present

## 2019-01-16 DIAGNOSIS — M25661 Stiffness of right knee, not elsewhere classified: Secondary | ICD-10-CM | POA: Diagnosis not present

## 2019-01-16 DIAGNOSIS — R29898 Other symptoms and signs involving the musculoskeletal system: Secondary | ICD-10-CM | POA: Diagnosis not present

## 2019-01-16 DIAGNOSIS — Z1239 Encounter for other screening for malignant neoplasm of breast: Secondary | ICD-10-CM | POA: Diagnosis not present

## 2019-01-16 DIAGNOSIS — M25561 Pain in right knee: Secondary | ICD-10-CM | POA: Diagnosis not present

## 2019-01-18 DIAGNOSIS — M25561 Pain in right knee: Secondary | ICD-10-CM | POA: Diagnosis not present

## 2019-01-18 DIAGNOSIS — R29898 Other symptoms and signs involving the musculoskeletal system: Secondary | ICD-10-CM | POA: Diagnosis not present

## 2019-01-18 DIAGNOSIS — M25661 Stiffness of right knee, not elsewhere classified: Secondary | ICD-10-CM | POA: Diagnosis not present

## 2019-01-23 DIAGNOSIS — M25561 Pain in right knee: Secondary | ICD-10-CM | POA: Diagnosis not present

## 2019-01-23 DIAGNOSIS — M25661 Stiffness of right knee, not elsewhere classified: Secondary | ICD-10-CM | POA: Diagnosis not present

## 2019-01-23 DIAGNOSIS — R29898 Other symptoms and signs involving the musculoskeletal system: Secondary | ICD-10-CM | POA: Diagnosis not present

## 2019-01-26 DIAGNOSIS — M25561 Pain in right knee: Secondary | ICD-10-CM | POA: Diagnosis not present

## 2019-01-26 DIAGNOSIS — M25661 Stiffness of right knee, not elsewhere classified: Secondary | ICD-10-CM | POA: Diagnosis not present

## 2019-01-26 DIAGNOSIS — R29898 Other symptoms and signs involving the musculoskeletal system: Secondary | ICD-10-CM | POA: Diagnosis not present

## 2019-01-29 DIAGNOSIS — G4733 Obstructive sleep apnea (adult) (pediatric): Secondary | ICD-10-CM | POA: Diagnosis not present

## 2019-01-31 DIAGNOSIS — M25561 Pain in right knee: Secondary | ICD-10-CM | POA: Diagnosis not present

## 2019-01-31 DIAGNOSIS — R29898 Other symptoms and signs involving the musculoskeletal system: Secondary | ICD-10-CM | POA: Diagnosis not present

## 2019-01-31 DIAGNOSIS — M25661 Stiffness of right knee, not elsewhere classified: Secondary | ICD-10-CM | POA: Diagnosis not present

## 2019-02-02 DIAGNOSIS — M25661 Stiffness of right knee, not elsewhere classified: Secondary | ICD-10-CM | POA: Diagnosis not present

## 2019-02-02 DIAGNOSIS — R29898 Other symptoms and signs involving the musculoskeletal system: Secondary | ICD-10-CM | POA: Diagnosis not present

## 2019-02-02 DIAGNOSIS — M25561 Pain in right knee: Secondary | ICD-10-CM | POA: Diagnosis not present

## 2019-02-09 DIAGNOSIS — M25561 Pain in right knee: Secondary | ICD-10-CM | POA: Diagnosis not present

## 2019-02-09 DIAGNOSIS — M25661 Stiffness of right knee, not elsewhere classified: Secondary | ICD-10-CM | POA: Diagnosis not present

## 2019-02-09 DIAGNOSIS — R29898 Other symptoms and signs involving the musculoskeletal system: Secondary | ICD-10-CM | POA: Diagnosis not present

## 2019-02-16 DIAGNOSIS — R29898 Other symptoms and signs involving the musculoskeletal system: Secondary | ICD-10-CM | POA: Diagnosis not present

## 2019-02-16 DIAGNOSIS — M25561 Pain in right knee: Secondary | ICD-10-CM | POA: Diagnosis not present

## 2019-02-16 DIAGNOSIS — M25661 Stiffness of right knee, not elsewhere classified: Secondary | ICD-10-CM | POA: Diagnosis not present

## 2019-02-23 DIAGNOSIS — R29898 Other symptoms and signs involving the musculoskeletal system: Secondary | ICD-10-CM | POA: Diagnosis not present

## 2019-02-23 DIAGNOSIS — M25661 Stiffness of right knee, not elsewhere classified: Secondary | ICD-10-CM | POA: Diagnosis not present

## 2019-02-23 DIAGNOSIS — M25561 Pain in right knee: Secondary | ICD-10-CM | POA: Diagnosis not present

## 2019-02-27 DIAGNOSIS — M25661 Stiffness of right knee, not elsewhere classified: Secondary | ICD-10-CM | POA: Diagnosis not present

## 2019-02-27 DIAGNOSIS — M25561 Pain in right knee: Secondary | ICD-10-CM | POA: Diagnosis not present

## 2019-02-27 DIAGNOSIS — R29898 Other symptoms and signs involving the musculoskeletal system: Secondary | ICD-10-CM | POA: Diagnosis not present

## 2019-03-07 DIAGNOSIS — M25661 Stiffness of right knee, not elsewhere classified: Secondary | ICD-10-CM | POA: Diagnosis not present

## 2019-03-07 DIAGNOSIS — R29898 Other symptoms and signs involving the musculoskeletal system: Secondary | ICD-10-CM | POA: Diagnosis not present

## 2019-03-07 DIAGNOSIS — M25561 Pain in right knee: Secondary | ICD-10-CM | POA: Diagnosis not present

## 2019-03-14 DIAGNOSIS — M25561 Pain in right knee: Secondary | ICD-10-CM | POA: Diagnosis not present

## 2019-03-14 DIAGNOSIS — R29898 Other symptoms and signs involving the musculoskeletal system: Secondary | ICD-10-CM | POA: Diagnosis not present

## 2019-03-14 DIAGNOSIS — M25661 Stiffness of right knee, not elsewhere classified: Secondary | ICD-10-CM | POA: Diagnosis not present

## 2019-03-22 DIAGNOSIS — M25561 Pain in right knee: Secondary | ICD-10-CM | POA: Diagnosis not present

## 2019-03-22 DIAGNOSIS — M25661 Stiffness of right knee, not elsewhere classified: Secondary | ICD-10-CM | POA: Diagnosis not present

## 2019-03-22 DIAGNOSIS — R29898 Other symptoms and signs involving the musculoskeletal system: Secondary | ICD-10-CM | POA: Diagnosis not present

## 2019-03-31 DIAGNOSIS — M25561 Pain in right knee: Secondary | ICD-10-CM | POA: Diagnosis not present

## 2019-03-31 DIAGNOSIS — R29898 Other symptoms and signs involving the musculoskeletal system: Secondary | ICD-10-CM | POA: Diagnosis not present

## 2019-03-31 DIAGNOSIS — M25661 Stiffness of right knee, not elsewhere classified: Secondary | ICD-10-CM | POA: Diagnosis not present

## 2019-04-10 DIAGNOSIS — M25561 Pain in right knee: Secondary | ICD-10-CM | POA: Diagnosis not present

## 2019-04-10 DIAGNOSIS — G8929 Other chronic pain: Secondary | ICD-10-CM | POA: Diagnosis not present

## 2019-04-10 DIAGNOSIS — R7303 Prediabetes: Secondary | ICD-10-CM | POA: Diagnosis not present

## 2019-04-10 DIAGNOSIS — M25562 Pain in left knee: Secondary | ICD-10-CM | POA: Diagnosis not present

## 2019-04-29 DIAGNOSIS — G4733 Obstructive sleep apnea (adult) (pediatric): Secondary | ICD-10-CM | POA: Diagnosis not present

## 2019-05-01 DIAGNOSIS — M25661 Stiffness of right knee, not elsewhere classified: Secondary | ICD-10-CM | POA: Diagnosis not present

## 2019-05-01 DIAGNOSIS — M25561 Pain in right knee: Secondary | ICD-10-CM | POA: Diagnosis not present

## 2019-05-01 DIAGNOSIS — R29898 Other symptoms and signs involving the musculoskeletal system: Secondary | ICD-10-CM | POA: Diagnosis not present

## 2019-05-04 DIAGNOSIS — M25561 Pain in right knee: Secondary | ICD-10-CM | POA: Diagnosis not present

## 2019-05-04 DIAGNOSIS — M25562 Pain in left knee: Secondary | ICD-10-CM | POA: Diagnosis not present

## 2019-05-08 DIAGNOSIS — M25661 Stiffness of right knee, not elsewhere classified: Secondary | ICD-10-CM | POA: Diagnosis not present

## 2019-05-08 DIAGNOSIS — R29898 Other symptoms and signs involving the musculoskeletal system: Secondary | ICD-10-CM | POA: Diagnosis not present

## 2019-05-08 DIAGNOSIS — M25561 Pain in right knee: Secondary | ICD-10-CM | POA: Diagnosis not present

## 2019-05-19 DIAGNOSIS — R29898 Other symptoms and signs involving the musculoskeletal system: Secondary | ICD-10-CM | POA: Diagnosis not present

## 2019-05-19 DIAGNOSIS — M25561 Pain in right knee: Secondary | ICD-10-CM | POA: Diagnosis not present

## 2019-05-19 DIAGNOSIS — M25661 Stiffness of right knee, not elsewhere classified: Secondary | ICD-10-CM | POA: Diagnosis not present

## 2019-05-22 DIAGNOSIS — M25561 Pain in right knee: Secondary | ICD-10-CM | POA: Diagnosis not present

## 2019-05-22 DIAGNOSIS — M25661 Stiffness of right knee, not elsewhere classified: Secondary | ICD-10-CM | POA: Diagnosis not present

## 2019-05-22 DIAGNOSIS — R29898 Other symptoms and signs involving the musculoskeletal system: Secondary | ICD-10-CM | POA: Diagnosis not present

## 2019-06-01 DIAGNOSIS — M25561 Pain in right knee: Secondary | ICD-10-CM | POA: Diagnosis not present

## 2019-06-01 DIAGNOSIS — R29898 Other symptoms and signs involving the musculoskeletal system: Secondary | ICD-10-CM | POA: Diagnosis not present

## 2019-06-01 DIAGNOSIS — M25661 Stiffness of right knee, not elsewhere classified: Secondary | ICD-10-CM | POA: Diagnosis not present

## 2019-07-13 ENCOUNTER — Other Ambulatory Visit: Payer: Self-pay

## 2019-07-13 ENCOUNTER — Emergency Department (HOSPITAL_BASED_OUTPATIENT_CLINIC_OR_DEPARTMENT_OTHER)
Admission: EM | Admit: 2019-07-13 | Discharge: 2019-07-13 | Disposition: A | Discharge status: Home / Self Care | Payer: BC Managed Care – PPO | Attending: Emergency Medicine | Admitting: Emergency Medicine

## 2019-07-13 ENCOUNTER — Encounter (HOSPITAL_BASED_OUTPATIENT_CLINIC_OR_DEPARTMENT_OTHER): Payer: Self-pay | Admitting: Emergency Medicine

## 2019-07-13 DIAGNOSIS — E119 Type 2 diabetes mellitus without complications: Secondary | ICD-10-CM | POA: Diagnosis not present

## 2019-07-13 DIAGNOSIS — Z87891 Personal history of nicotine dependence: Secondary | ICD-10-CM | POA: Diagnosis not present

## 2019-07-13 DIAGNOSIS — R9431 Abnormal electrocardiogram [ECG] [EKG]: Secondary | ICD-10-CM | POA: Diagnosis not present

## 2019-07-13 DIAGNOSIS — M25512 Pain in left shoulder: Secondary | ICD-10-CM | POA: Diagnosis not present

## 2019-07-13 DIAGNOSIS — M79602 Pain in left arm: Secondary | ICD-10-CM | POA: Insufficient documentation

## 2019-07-13 MED ORDER — LIDOCAINE 5 % EX PTCH: 1.0000 | MEDICATED_PATCH | CUTANEOUS | 0 refills | Status: DC

## 2019-07-13 MED ORDER — LIDOCAINE 5 % EX PTCH
1.0000 | MEDICATED_PATCH | CUTANEOUS | Status: DC
  Administered 2019-07-13: 1 via TRANSDERMAL
  Filled 2019-07-13: qty 1

## 2019-07-13 MED ORDER — DICLOFENAC SODIUM 1 % EX GEL
4.0000 g | CUTANEOUS | Status: DC
  Filled 2019-07-13: qty 100

## 2019-07-13 MED ORDER — ACETAMINOPHEN 500 MG PO TABS
1000.0000 mg | ORAL_TABLET | Freq: Once | ORAL | Status: AC
  Administered 2019-07-13: 1000 mg via ORAL
  Filled 2019-07-13: qty 2

## 2019-07-13 NOTE — ED Provider Notes (Addendum)
[facility] EMERGENCY DEPARTMENT Provider Note   CSN: [id number] Arrival date & time: 07/13/19  0347     History Chief Complaint  Patient presents with  . Arm Pain    Terri Hunt is a 44 y.o. female.  The history is provided by the patient.  Arm Pain This is a new problem. The current episode started 3 to 5 hours ago. The problem occurs constantly. The problem has not changed since onset.Pertinent negatives include no chest pain, no abdominal pain, no headaches and no shortness of breath. Nothing aggravates the symptoms. The symptoms are relieved by ice. Treatments tried: ice  The treatment provided moderate relief.  Patient has had B shoulder pain and LAN in the axiall since her covid vaccine.  She slept on the left arm and it now hurts as the way down. It is worse with the arm in the dependent position.  She denies CP, no exertional CP, no DOE.  No n/v/d.  No neck or back pain.  No weakness.  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      Past Medical History:  Diagnosis Date  . Allergy   . Anemia    hx  . Arthritis   . 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 Diabetes mellitus without complication ([place])   . Dieting    [employer] [facility] thru [facility] since 10/06/2012 - current wgrt loss 100 lbs  . Endometrial polyp 2015   Resectoscopic polypectomy  . GERD (gastroesophageal reflux disease)   . Hyperlipidemia    per patient - diet  controlled  . Seizures ([place])    last seizure 06/2009   . Sleep apnea    does not use CPAP - bed is elevated per patient    Patient Active Problem List   Diagnosis Date Noted  . OSA (obstructive sleep apnea) 09/18/2013  . Morbidly obese ([place]) 01/10/2013  . Dysmenorrhea 01/10/2013    Past Surgical History:  Procedure Laterality Date  . CESAREAN SECTION    . CHOLECYSTECTOMY  2001  . DILATATION & CURETTAGE/HYSTEROSCOPY WITH TRUECLEAR N/A  02/24/2013   Procedure: DILATATION & CURETTAGE/HYSTEROSCOPY WITH TRUCLEAR;  Surgeon: Terrance Mass, MD;  Location: [facility];  Service: Gynecology;  Laterality: N/A;  with resection of endometrial polyp   . ESOPHAGOGASTRODUODENOSCOPY ENDOSCOPY    . HAND SURGERY     right hand  . Resectoscopic polypectomy  2015  . WISDOM TOOTH EXTRACTION       OB History    Gravida  1   Para  1   Term      Preterm      AB      Living  2     SAB      TAB      Ectopic      Multiple  1   Live Births              Family History  Problem Relation Age of Onset  . Diabetes Mother   . Sleep apnea Mother   . Cancer Father        SKIN  . Hypertension Father   . Cancer Paternal Uncle        prostate  . Cancer Maternal Grandfather   . Heart disease Paternal Grandfather   . Parkinson's disease Paternal Grandfather   . Bipolar disorder Sister   . Sleep apnea Maternal Grandmother   .  Sleep apnea Brother     Social History   Tobacco Use  . Smoking status: Former Smoker    Packs/day: 0.50    Years: 4.00    Pack years: 2.00    Types: Cigarettes    Quit date: 01/11/2003    Years since quitting: 16.5  . Smokeless tobacco: Never Used  Vaping Use  . Vaping Use: Never used  Substance Use Topics  . Alcohol use: No  . Drug use: No    Home Medications Prior to Admission medications   Medication Sig Start Date End Date Taking? Authorizing Provider  cefdinir (OMNICEF) 300 MG capsule  07/10/19   [provider]  hydrocortisone-pramoxine (ANALPRAM HC) 2.5-1 % rectal cream Place 1 application 3 (three) times daily rectally. 12/21/16   Huel Cote, NP  Loratadine (CLARITIN PO) Take 10 mg by mouth daily.     [provider]  medroxyPROGESTERone (DEPO-PROVERA) 150 MG/ML injection Inject 1 mL (150 mg total) into the muscle once for 1 dose. Every 3 months 12/26/18 12/26/18  Huel Cote, NP  meloxicam (MOBIC) 7.5 MG tablet Take 1 tablet by mouth daily. 11/15/18    [provider]  montelukast (SINGULAIR) 10 MG tablet Take 10 mg by mouth at bedtime.    [provider]    Allergies    Advil [ibuprofen], Almond meal, Fluorouracil, and Other  Review of Systems   Review of Systems  Constitutional: Negative for diaphoresis, fatigue and fever.  HENT: Negative for congestion.   Eyes: Negative for visual disturbance.  Respiratory: Negative for chest tightness and shortness of breath.   Cardiovascular: Negative for chest pain, palpitations and leg swelling.  Gastrointestinal: Negative for abdominal pain, nausea and vomiting.  Musculoskeletal: Positive for arthralgias. Negative for joint swelling and neck pain.  Skin: Negative for rash.  Neurological: Negative for weakness, numbness and headaches.  Psychiatric/Behavioral: Negative for agitation.  All other systems reviewed and are negative.   Physical Exam Updated Vital Signs BP (!) 159/95 (BP Location: Right Arm)   Pulse 100   Temp 98.4 F (36.9 C) (Oral)   Resp 18   Wt (!) 164 kg   SpO2 100%   BMI 66.13 kg/m   Physical Exam Vitals and nursing note reviewed.  Constitutional:      General: She is not in acute distress.    Appearance: Normal appearance.  HENT:     Head: Normocephalic and atraumatic.     Nose: Nose normal.  Eyes:     Conjunctiva/sclera: Conjunctivae normal.     Pupils: Pupils are equal, round, and reactive to light.  Cardiovascular:     Rate and Rhythm: Normal rate and regular rhythm.     Pulses: Normal pulses.     Heart sounds: Normal heart sounds.  Pulmonary:     Effort: Pulmonary effort is normal.     Breath sounds: Normal breath sounds.  Abdominal:     General: Abdomen is flat. Bowel sounds are normal.     Tenderness: There is no abdominal tenderness. There is no guarding or rebound.  Musculoskeletal:        General: No swelling, tenderness or deformity. Normal range of motion.     Left shoulder: Normal.     Left upper arm: Normal.     Left  elbow: Normal.     Left forearm: Normal.     Left wrist: Normal. No snuff box tenderness.     Left hand: Normal.     Cervical back: Normal  range of motion and neck supple.  Lymphadenopathy:     Cervical: No cervical adenopathy.     Left cervical: No superficial, deep or posterior cervical adenopathy.     Upper Body:     Left upper body: No supraclavicular, axillary, pectoral or epitrochlear adenopathy.  Skin:    General: Skin is warm and dry.     Capillary Refill: Capillary refill takes less than 2 seconds.  Neurological:     General: No focal deficit present.     Mental Status: She is alert and oriented to person, place, and time.     Deep Tendon Reflexes: Reflexes normal.  Psychiatric:        Mood and Affect: Mood normal.        Behavior: Behavior normal.     ED Results / Procedures / Treatments   Labs (all labs ordered are listed, but only abnormal results are displayed) Labs Reviewed - No data to display  EKG EKG Interpretation  Date/Time:  Thursday July 13 2019 06:10:36 EDT Ventricular Rate:  87 PR Interval:    QRS Duration: 108 QT Interval:  369 QTC Calculation: 444 R Axis:   123 Text Interpretation: Sinus rhythm Right axis deviation Low voltage, precordial leads no change from 2011 Confirmed by Conway, Hutch Rhett ([id number]) on 07/13/2019 6:17:22 AM   Radiology No results found.  Procedures Procedures (including critical care time)  Medications Ordered in ED Medications  lidocaine (LIDODERM) 5 % 1 patch (has no administration in time range)  diclofenac Sodium (VOLTAREN) 1 % topical gel 4 g (has no administration in time range)    ED Course  I have reviewed the triage vital signs and the nursing notes.  Pertinent labs & imaging results that were available during my care of the patient were reviewed by me and considered in my medical decision making (see chart for details).    I do not believe this is cardiac, EKG is normal and unchanged from 2011 and she had a  normal coronary CT scan within the past nine months.  I suspect this is from lifting the children or having them sleep on her arms.  Will add lidoderm to patient's home regimen.  Have advised ice and elevation.  Strict return precautions for chest pain, shortness of breath or any concerns.   Follow up with your PMD.    ANNEMARIE SEBREE was evaluated in Emergency Department on 07/13/2019 for the symptoms described in the history of present illness. She was evaluated in the context of the global COVID-19 pandemic, which necessitated consideration that the patient might be at risk for infection with the SARS-CoV-2 virus that causes COVID-19. Institutional protocols and algorithms that pertain to the evaluation of patients at risk for COVID-19 are in a state of rapid change based on information released by regulatory bodies including the CDC and federal and state organizations. These policies and algorithms were followed during the patient's care in the ED.  Final Clinical Impression(s) / ED Diagnoses Final diagnoses:  Left arm pain   Return for intractable cough, coughing up blood,fevers >100.4 unrelieved by medication, shortness of breath, intractable vomiting, chest pain, shortness of breath, weakness,numbness, changes in speech, facial asymmetry,abdominal pain, passing out,Inability to tolerate liquids or food, cough, altered mental status or any concerns. No signs of systemic illness or infection. The patient is nontoxic-appearing on exam and vital signs are within normal limits.   I have reviewed the triage vital signs and the nursing notes. Pertinent labs &imaging results that were  available during my care of the patient were reviewed by me and considered in my medical decision making (see chart for details).After history, exam, and medical workup I feel the patient has beenappropriately medically screened and is safe for discharge home. Pertinent diagnoses were discussed with the patient.  Patient was given return precautions.    Tron Flythe, MD 07/13/19 1499    Veatrice Kells, MD 07/13/19 0630

## 2019-07-13 NOTE — ED Triage Notes (Signed)
Patient presents with complaints of left arm pain onset this am upon waking; denies any known trauma; states 2 days ago noted bilateral shoulder pain and pain in left axilla lymph node; nad noted.

## 2019-07-14 DIAGNOSIS — M25561 Pain in right knee: Secondary | ICD-10-CM | POA: Diagnosis not present

## 2019-07-14 DIAGNOSIS — Z6841 Body Mass Index (BMI) 40.0 and over, adult: Secondary | ICD-10-CM | POA: Diagnosis not present

## 2019-07-14 DIAGNOSIS — R7303 Prediabetes: Secondary | ICD-10-CM | POA: Diagnosis not present

## 2019-07-28 DIAGNOSIS — G4733 Obstructive sleep apnea (adult) (pediatric): Secondary | ICD-10-CM | POA: Diagnosis not present

## 2019-08-10 DIAGNOSIS — E119 Type 2 diabetes mellitus without complications: Secondary | ICD-10-CM | POA: Diagnosis not present

## 2019-08-10 DIAGNOSIS — R2242 Localized swelling, mass and lump, left lower limb: Secondary | ICD-10-CM | POA: Diagnosis not present

## 2019-08-10 DIAGNOSIS — L0889 Other specified local infections of the skin and subcutaneous tissue: Secondary | ICD-10-CM | POA: Diagnosis not present

## 2019-08-10 DIAGNOSIS — Z791 Long term (current) use of non-steroidal anti-inflammatories (NSAID): Secondary | ICD-10-CM | POA: Diagnosis not present

## 2019-08-24 DIAGNOSIS — M25561 Pain in right knee: Secondary | ICD-10-CM | POA: Diagnosis not present

## 2019-08-24 DIAGNOSIS — M25562 Pain in left knee: Secondary | ICD-10-CM | POA: Diagnosis not present

## 2019-10-23 DIAGNOSIS — R2242 Localized swelling, mass and lump, left lower limb: Secondary | ICD-10-CM | POA: Diagnosis not present

## 2019-10-24 ENCOUNTER — Other Ambulatory Visit (HOSPITAL_COMMUNITY): Payer: Self-pay | Admitting: Internal Medicine

## 2019-10-24 DIAGNOSIS — M79605 Pain in left leg: Secondary | ICD-10-CM

## 2019-10-24 DIAGNOSIS — R600 Localized edema: Secondary | ICD-10-CM

## 2019-10-25 ENCOUNTER — Ambulatory Visit (HOSPITAL_COMMUNITY)
Admission: RE | Admit: 2019-10-25 | Discharge: 2019-10-25 | Disposition: A | Discharge status: Home / Self Care | Payer: BC Managed Care – PPO | Source: Ambulatory Visit | Attending: Internal Medicine | Admitting: Internal Medicine

## 2019-10-25 ENCOUNTER — Other Ambulatory Visit: Payer: Self-pay

## 2019-10-25 DIAGNOSIS — M79605 Pain in left leg: Secondary | ICD-10-CM | POA: Diagnosis not present

## 2019-10-25 DIAGNOSIS — R600 Localized edema: Secondary | ICD-10-CM

## 2019-10-25 NOTE — Progress Notes (Signed)
Venous duplex completed. Called results to [facility] at Dr. Keane Police office.   June Leap, BS, RDMS, RVT

## 2019-10-27 DIAGNOSIS — G4733 Obstructive sleep apnea (adult) (pediatric): Secondary | ICD-10-CM | POA: Diagnosis not present

## 2019-11-13 DIAGNOSIS — I872 Venous insufficiency (chronic) (peripheral): Secondary | ICD-10-CM | POA: Diagnosis not present

## 2019-11-13 DIAGNOSIS — R2242 Localized swelling, mass and lump, left lower limb: Secondary | ICD-10-CM | POA: Diagnosis not present

## 2019-11-16 DIAGNOSIS — R7303 Prediabetes: Secondary | ICD-10-CM | POA: Diagnosis not present

## 2019-11-16 DIAGNOSIS — Z6841 Body Mass Index (BMI) 40.0 and over, adult: Secondary | ICD-10-CM | POA: Diagnosis not present

## 2019-11-17 DIAGNOSIS — M7989 Other specified soft tissue disorders: Secondary | ICD-10-CM | POA: Diagnosis not present

## 2019-11-23 DIAGNOSIS — M170 Bilateral primary osteoarthritis of knee: Secondary | ICD-10-CM | POA: Diagnosis not present

## 2019-11-28 DIAGNOSIS — I10 Essential (primary) hypertension: Secondary | ICD-10-CM | POA: Diagnosis not present

## 2019-11-30 DIAGNOSIS — M7989 Other specified soft tissue disorders: Secondary | ICD-10-CM | POA: Diagnosis not present

## 2019-12-07 DIAGNOSIS — M25562 Pain in left knee: Secondary | ICD-10-CM | POA: Diagnosis not present

## 2019-12-07 DIAGNOSIS — R2242 Localized swelling, mass and lump, left lower limb: Secondary | ICD-10-CM | POA: Diagnosis not present

## 2019-12-07 DIAGNOSIS — M25561 Pain in right knee: Secondary | ICD-10-CM | POA: Diagnosis not present

## 2019-12-07 DIAGNOSIS — G8929 Other chronic pain: Secondary | ICD-10-CM | POA: Diagnosis not present

## 2019-12-15 DIAGNOSIS — R2242 Localized swelling, mass and lump, left lower limb: Secondary | ICD-10-CM | POA: Diagnosis not present

## 2019-12-15 DIAGNOSIS — M25561 Pain in right knee: Secondary | ICD-10-CM | POA: Diagnosis not present

## 2019-12-15 DIAGNOSIS — G8929 Other chronic pain: Secondary | ICD-10-CM | POA: Diagnosis not present

## 2019-12-15 DIAGNOSIS — M25562 Pain in left knee: Secondary | ICD-10-CM | POA: Diagnosis not present

## 2019-12-22 DIAGNOSIS — M25561 Pain in right knee: Secondary | ICD-10-CM | POA: Diagnosis not present

## 2019-12-22 DIAGNOSIS — M25562 Pain in left knee: Secondary | ICD-10-CM | POA: Diagnosis not present

## 2019-12-22 DIAGNOSIS — R2242 Localized swelling, mass and lump, left lower limb: Secondary | ICD-10-CM | POA: Diagnosis not present

## 2019-12-22 DIAGNOSIS — G8929 Other chronic pain: Secondary | ICD-10-CM | POA: Diagnosis not present

## 2019-12-27 ENCOUNTER — Other Ambulatory Visit: Payer: Self-pay

## 2019-12-27 ENCOUNTER — Ambulatory Visit (INDEPENDENT_AMBULATORY_CARE_PROVIDER_SITE_OTHER): Payer: BC Managed Care – PPO | Admitting: Nurse Practitioner

## 2019-12-27 VITALS — BP 150/82 | Ht 62.0 in | Wt 387.0 lb

## 2019-12-27 DIAGNOSIS — Z6841 Body Mass Index (BMI) 40.0 and over, adult: Secondary | ICD-10-CM

## 2019-12-27 DIAGNOSIS — Z01419 Encounter for gynecological examination (general) (routine) without abnormal findings: Secondary | ICD-10-CM | POA: Diagnosis not present

## 2019-12-27 DIAGNOSIS — Z3042 Encounter for surveillance of injectable contraceptive: Secondary | ICD-10-CM

## 2019-12-27 MED ORDER — MEDROXYPROGESTERONE ACETATE 150 MG/ML IM SUSP: 150.0000 mg | INTRAMUSCULAR | 3 refills | Status: AC

## 2019-12-27 NOTE — Patient Instructions (Signed)
Health Maintenance, Female Adopting a healthy lifestyle and getting preventive care are important in promoting health and wellness. Ask your health care provider about:  The right schedule for you to have regular tests and exams.  Things you can do on your own to prevent diseases and keep yourself healthy. What should I know about diet, weight, and exercise? Eat a healthy diet   Eat a diet that includes plenty of vegetables, fruits, low-fat dairy products, and lean protein.  Do not eat a lot of foods that are high in solid fats, added sugars, or sodium. Maintain a healthy weight Body mass index (BMI) is used to identify weight problems. It estimates body fat based on height and weight. Your health care provider can help determine your BMI and help you achieve or maintain a healthy weight. Get regular exercise Get regular exercise. This is one of the most important things you can do for your health. Most adults should:  Exercise for at least 150 minutes each week. The exercise should increase your heart rate and make you sweat (moderate-intensity exercise).  Do strengthening exercises at least twice a week. This is in addition to the moderate-intensity exercise.  Spend less time sitting. Even light physical activity can be beneficial. Watch cholesterol and blood lipids Have your blood tested for lipids and cholesterol at 44 years of age, then have this test every 5 years. Have your cholesterol levels checked more often if:  Your lipid or cholesterol levels are high.  You are older than 44 years of age.  You are at high risk for heart disease. What should I know about cancer screening? Depending on your health history and family history, you may need to have cancer screening at various ages. This may include screening for:  Breast cancer.  Cervical cancer.  Colorectal cancer.  Skin cancer.  Lung cancer. What should I know about heart disease, diabetes, and high blood  pressure? Blood pressure and heart disease  High blood pressure causes heart disease and increases the risk of stroke. This is more likely to develop in people who have high blood pressure readings, are of African descent, or are overweight.  Have your blood pressure checked: ? Every 3-5 years if you are 18-39 years of age. ? Every year if you are 40 years old or older. Diabetes Have regular diabetes screenings. This checks your fasting blood sugar level. Have the screening done:  Once every three years after age 40 if you are at a normal weight and have a low risk for diabetes.  More often and at a younger age if you are overweight or have a high risk for diabetes. What should I know about preventing infection? Hepatitis B If you have a higher risk for hepatitis B, you should be screened for this virus. Talk with your health care provider to find out if you are at risk for hepatitis B infection. Hepatitis C Testing is recommended for:  Everyone born from 1945 through 1965.  Anyone with known risk factors for hepatitis C. Sexually transmitted infections (STIs)  Get screened for STIs, including gonorrhea and chlamydia, if: ? You are sexually active and are younger than 44 years of age. ? You are older than 44 years of age and your health care provider tells you that you are at risk for this type of infection. ? Your sexual activity has changed since you were last screened, and you are at increased risk for chlamydia or gonorrhea. Ask your health care provider if   you are at risk.  Ask your health care provider about whether you are at high risk for HIV. Your health care provider may recommend a prescription medicine to help prevent HIV infection. If you choose to take medicine to prevent HIV, you should first get tested for HIV. You should then be tested every 3 months for as long as you are taking the medicine. Pregnancy  If you are about to stop having your period (premenopausal) and  you may become pregnant, seek counseling before you get pregnant.  Take 400 to 800 micrograms (mcg) of folic acid every day if you become pregnant.  Ask for birth control (contraception) if you want to prevent pregnancy. Osteoporosis and menopause Osteoporosis is a disease in which the bones lose minerals and strength with aging. This can result in bone fractures. If you are 65 years old or older, or if you are at risk for osteoporosis and fractures, ask your health care provider if you should:  Be screened for bone loss.  Take a calcium or vitamin D supplement to lower your risk of fractures.  Be given hormone replacement therapy (HRT) to treat symptoms of menopause. Follow these instructions at home: Lifestyle  Do not use any products that contain nicotine or tobacco, such as cigarettes, e-cigarettes, and chewing tobacco. If you need help quitting, ask your health care provider.  Do not use street drugs.  Do not share needles.  Ask your health care provider for help if you need support or information about quitting drugs. Alcohol use  Do not drink alcohol if: ? Your health care provider tells you not to drink. ? You are pregnant, may be pregnant, or are planning to become pregnant.  If you drink alcohol: ? Limit how much you use to 0-1 drink a day. ? Limit intake if you are breastfeeding.  Be aware of how much alcohol is in your drink. In the [place] one drink equals one 12 oz bottle of beer (355 mL), one 5 oz glass of wine (148 mL), or one 1 oz glass of hard liquor (44 mL). General instructions  Schedule regular health, dental, and eye exams.  Stay current with your vaccines.  Tell your health care provider if: ? You often feel depressed. ? You have ever been abused or do not feel safe at home. Summary  Adopting a healthy lifestyle and getting preventive care are important in promoting health and wellness.  Follow your health care provider's instructions about healthy  diet, exercising, and getting tested or screened for diseases.  Follow your health care provider's instructions on monitoring your cholesterol and blood pressure. This information is not intended to replace advice given to you by your health care provider. Make sure you discuss any questions you have with your health care provider. Document Revised: 01/12/2018 Document Reviewed: 01/12/2018 Elsevier Patient Education  2020 [employer] Inc.  

## 2019-12-27 NOTE — Progress Notes (Signed)
   Terri Hunt [birth year] [id number]   History:  44 y.o. G1P1002 presents for annual exam without GYN complaints. Depo Provera every 90 days, husband administers. Next dose due in early January. Amenorrheic. Normal pap and mammogram history. 2019 benign cervical polyp. Being seen at weight loss clinic.   Gynecologic History No LMP recorded (lmp unknown). Patient has had an injection.   Contraception: Depo-Provera injections Last Pap: 12/2016. Results were: normal Last mammogram: 01/2019. Results were: normal  Past medical history, past surgical history, family history and social history were all reviewed and documented in the EPIC chart.  ROS:  A ROS was performed and pertinent positives and negatives are included.  Exam:  Vitals:   12/27/19 1034  BP: (!) 160/80  Weight: (!) 387 lb (175.5 kg)  Height: 5\' 2"  (1.575 m)   Body mass index is 70.78 kg/m.  General appearance:  Normal Thyroid:  Symmetrical, normal in size, without palpable masses or nodularity. Respiratory  Auscultation:  Clear without wheezing or rhonchi Cardiovascular  Auscultation:  Regular rate, without rubs, murmurs or gallops  Edema/varicosities:  Not grossly evident Abdominal  Soft,nontender, without masses, guarding or rebound.  Liver/spleen:  No organomegaly noted  Hernia:  None appreciated  Skin  Inspection:  Grossly normal   Breasts: Examined lying and sitting.   Right: Without masses, retractions, discharge or axillary adenopathy.   Left: Without masses, retractions, discharge or axillary adenopathy. Gentitourinary   Inguinal/mons:  Normal without inguinal adenopathy  External genitalia:  Normal  BUS/Urethra/Skene's glands:  Normal  Vagina:  Normal  Cervix:  Normal  Uterus:  Unable to palpate due to body habitus but no gross masses or tenderness  Adnexa/parametria:     Rt: Without masses or tenderness.   Lt: Without masses or tenderness.  Anus and perineum: Normal  Assessment/Plan:  44  y.o. G1P1002 for annual exam.   Well female exam with routine gynecological exam - Education provided on SBEs, importance of preventative screenings, current guidelines, high calcium diet, regular exercise, and multivitamin daily. Labs with PCP-has appointment next month.   Encounter for surveillance of injectable contraceptive - Plan: medroxyPROGESTERone (DEPO-PROVERA) 150 MG/ML injection every 90 days. Refill x 1 year provided.  Husband administers. Amenorrheic. Next dose due in early January.   BMI 70 and over, adult [facility]) - being seen at a weight loss clinic. Lost 200 pounds in the past with [employer]if[employer].   Screening for cervical cancer - Normal pap history. Will repeat pap at 5-year interval per guidelines.   Screening for breast cancer - Normal mammogram history. Continue annual screenings. Mammogram scheduled next month. Normal breast exam today.   Follow up in 1 year for annual.     Tamela Gammon [facility], 10:52 AM 12/27/2019

## 2020-01-04 DIAGNOSIS — E785 Hyperlipidemia, unspecified: Secondary | ICD-10-CM | POA: Diagnosis not present

## 2020-01-04 DIAGNOSIS — E119 Type 2 diabetes mellitus without complications: Secondary | ICD-10-CM | POA: Diagnosis not present

## 2020-01-05 DIAGNOSIS — G8929 Other chronic pain: Secondary | ICD-10-CM | POA: Diagnosis not present

## 2020-01-05 DIAGNOSIS — M25561 Pain in right knee: Secondary | ICD-10-CM | POA: Diagnosis not present

## 2020-01-05 DIAGNOSIS — R2242 Localized swelling, mass and lump, left lower limb: Secondary | ICD-10-CM | POA: Diagnosis not present

## 2020-01-05 DIAGNOSIS — Z6841 Body Mass Index (BMI) 40.0 and over, adult: Secondary | ICD-10-CM | POA: Diagnosis not present

## 2020-01-11 DIAGNOSIS — Z0000 Encounter for general adult medical examination without abnormal findings: Secondary | ICD-10-CM | POA: Diagnosis not present

## 2020-01-11 DIAGNOSIS — E119 Type 2 diabetes mellitus without complications: Secondary | ICD-10-CM | POA: Diagnosis not present

## 2020-01-11 DIAGNOSIS — I10 Essential (primary) hypertension: Secondary | ICD-10-CM | POA: Diagnosis not present

## 2020-01-19 DIAGNOSIS — Z1231 Encounter for screening mammogram for malignant neoplasm of breast: Secondary | ICD-10-CM | POA: Diagnosis not present

## 2020-01-19 DIAGNOSIS — M25561 Pain in right knee: Secondary | ICD-10-CM | POA: Diagnosis not present

## 2020-01-19 DIAGNOSIS — Z1239 Encounter for other screening for malignant neoplasm of breast: Secondary | ICD-10-CM | POA: Diagnosis not present

## 2020-01-19 DIAGNOSIS — R2242 Localized swelling, mass and lump, left lower limb: Secondary | ICD-10-CM | POA: Diagnosis not present

## 2020-01-19 DIAGNOSIS — Z6841 Body Mass Index (BMI) 40.0 and over, adult: Secondary | ICD-10-CM | POA: Diagnosis not present

## 2020-01-19 DIAGNOSIS — G8929 Other chronic pain: Secondary | ICD-10-CM | POA: Diagnosis not present

## 2020-01-25 DIAGNOSIS — G4733 Obstructive sleep apnea (adult) (pediatric): Secondary | ICD-10-CM | POA: Diagnosis not present

## 2020-01-25 DIAGNOSIS — Z03818 Encounter for observation for suspected exposure to other biological agents ruled out: Secondary | ICD-10-CM | POA: Diagnosis not present

## 2020-01-29 DIAGNOSIS — M25561 Pain in right knee: Secondary | ICD-10-CM | POA: Diagnosis not present

## 2020-01-29 DIAGNOSIS — R2242 Localized swelling, mass and lump, left lower limb: Secondary | ICD-10-CM | POA: Diagnosis not present

## 2020-01-29 DIAGNOSIS — Z6841 Body Mass Index (BMI) 40.0 and over, adult: Secondary | ICD-10-CM | POA: Diagnosis not present

## 2020-01-29 DIAGNOSIS — G8929 Other chronic pain: Secondary | ICD-10-CM | POA: Diagnosis not present

## 2020-02-27 DIAGNOSIS — M170 Bilateral primary osteoarthritis of knee: Secondary | ICD-10-CM | POA: Diagnosis not present

## 2020-03-14 DIAGNOSIS — Z9989 Dependence on other enabling machines and devices: Secondary | ICD-10-CM | POA: Diagnosis not present

## 2020-03-14 DIAGNOSIS — G4733 Obstructive sleep apnea (adult) (pediatric): Secondary | ICD-10-CM | POA: Diagnosis not present

## 2020-04-02 DIAGNOSIS — E119 Type 2 diabetes mellitus without complications: Secondary | ICD-10-CM | POA: Diagnosis not present

## 2020-04-02 DIAGNOSIS — R0600 Dyspnea, unspecified: Secondary | ICD-10-CM | POA: Diagnosis not present

## 2020-04-04 ENCOUNTER — Encounter: Payer: Self-pay | Admitting: Cardiology

## 2020-04-04 ENCOUNTER — Encounter: Payer: Self-pay | Admitting: Nurse Practitioner

## 2020-04-04 ENCOUNTER — Other Ambulatory Visit: Payer: Self-pay

## 2020-04-04 ENCOUNTER — Ambulatory Visit: Payer: BC Managed Care – PPO | Admitting: Cardiology

## 2020-04-04 VITALS — BP 145/100 | HR 105 | Temp 98.0°F | Resp 17 | Ht 62.0 in | Wt 385.0 lb

## 2020-04-04 DIAGNOSIS — G4733 Obstructive sleep apnea (adult) (pediatric): Secondary | ICD-10-CM

## 2020-04-04 DIAGNOSIS — I10 Essential (primary) hypertension: Secondary | ICD-10-CM

## 2020-04-04 DIAGNOSIS — E119 Type 2 diabetes mellitus without complications: Secondary | ICD-10-CM

## 2020-04-04 DIAGNOSIS — R0609 Other forms of dyspnea: Secondary | ICD-10-CM

## 2020-04-04 DIAGNOSIS — I5032 Chronic diastolic (congestive) heart failure: Secondary | ICD-10-CM | POA: Diagnosis not present

## 2020-04-04 DIAGNOSIS — Z6841 Body Mass Index (BMI) 40.0 and over, adult: Secondary | ICD-10-CM

## 2020-04-04 DIAGNOSIS — E7800 Pure hypercholesterolemia, unspecified: Secondary | ICD-10-CM | POA: Diagnosis not present

## 2020-04-04 DIAGNOSIS — R0600 Dyspnea, unspecified: Secondary | ICD-10-CM

## 2020-04-04 MED ORDER — SPIRONOLACTONE 25 MG PO TABS: 25.0000 mg | ORAL_TABLET | ORAL | 2 refills | Status: AC

## 2020-04-04 MED ORDER — ATORVASTATIN CALCIUM 40 MG PO TABS: 40.0000 mg | ORAL_TABLET | Freq: Every day | ORAL | 2 refills | Status: AC

## 2020-04-04 NOTE — Progress Notes (Signed)
Primary Physician/Referring:  Shon Baton, MD  Patient ID: Terri Hunt, female    DOB: 1975-05-20, 45 y.o.   MRN: [id number]  Chief Complaint  Patient presents with  . Shortness of Breath  . New Patient (Initial Visit)   HPI:    Terri Hunt  is a 45 y.o. Caucasian female with long-term history of obesity, has had significant weight loss but has gained it back several times, she is a mother of twins who are 92 years of age, hypertension, hyperlipidemia, OSA on CPAP, diabetes mellitus who was referred to me for evaluation of dyspnea on exertion.  Patient states that over the last few weeks she has suddenly noticed rapid onset of dyspnea even doing minimal activities.  States that she was markedly dyspneic even walking from parking lot to here.  Denies any worsening leg edema although she does take Lasix for mild chronic leg edema left worse than the right.  Denies any leg discomfort or painful swelling of the lower extremity although she does have arthritis.  No chest pain or palpitations.  She has been compliant with her CPAP.  Past Medical History:  Diagnosis Date  . Allergy   . Anemia    hx  . Arthritis   . 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 Diabetes mellitus without complication ([place])   . Dieting    [employer] Weight Loss Program thru [facility] since 10/06/2012 - current wgrt loss 100 lbs  . Endometrial polyp 2015   Resectoscopic polypectomy  . GERD (gastroesophageal reflux disease)   . Hyperlipidemia    per patient - diet  controlled  . Seizures ([place])    last seizure 06/2009   . Sleep apnea    Uses CPAP regularly   Past Surgical History:  Procedure Laterality Date  . CESAREAN SECTION    . CHOLECYSTECTOMY  2001  . DILATATION & CURETTAGE/HYSTEROSCOPY WITH TRUECLEAR N/A 02/24/2013   Procedure: DILATATION & CURETTAGE/HYSTEROSCOPY WITH TRUCLEAR;  Surgeon: Terrance Mass, MD;  Location: [facility];  Service: Gynecology;  Laterality: N/A;  with  resection of endometrial polyp   . ESOPHAGOGASTRODUODENOSCOPY ENDOSCOPY    . HAND SURGERY     right hand  . Resectoscopic polypectomy  2015  . WISDOM TOOTH EXTRACTION     Family History  Problem Relation Age of Onset  . Diabetes Mother   . Sleep apnea Mother   . Cancer Father        SKIN  . Hypertension Father   . Cancer Paternal Uncle        prostate  . Cancer Maternal Grandfather   . Heart disease Paternal Grandfather   . Parkinson's disease Paternal Grandfather   . Bipolar disorder Sister   . Sleep apnea Maternal Grandmother   . Sleep apnea Brother     Social History   Tobacco Use  . Smoking status: Former Smoker    Packs/day: 0.50    Years: 4.00    Pack years: 2.00    Types: Cigarettes    Quit date: 01/11/2003    Years since quitting: 17.2  . Smokeless tobacco: Never Used  Substance Use Topics  . Alcohol use: No   Marital Status: Married  ROS  Review of Systems  Cardiovascular: Positive for dyspnea on exertion and leg swelling. Negative for chest pain.  Musculoskeletal: Positive for arthritis.  Gastrointestinal: Negative for melena.   Objective  Blood pressure (!) 145/100, pulse (!) 105, temperature 98 F (36.7  C), resp. rate 17, height _0  (1.575 m), weight (!) 385 lb (174.6 kg), SpO2 97 %.  Vitals with BMI 04/04/2020 04/04/2020 12/27/2019  Height - _1  _2   Weight - 385 lbs 387 lbs  BMI - 81.4 48.18  Systolic 563 149 702  Diastolic 637 858 82  Pulse 105 109 -     Physical Exam Constitutional:      Comments: Morbidly obese in no acute distress.  Cardiovascular:     Rate and Rhythm: Normal rate and regular rhythm.     Pulses:          Carotid pulses are 2+ on the right side and 2+ on the left side.      Dorsalis pedis pulses are 2+ on the right side and 2+ on the left side.       Posterior tibial pulses are 2+ on the right side and 2+ on the left side.     Heart sounds: Normal heart sounds. No murmur heard. No gallop.      Comments: Femoral and  popliteal pulse difficult to feel due to patient's body habitus.  No leg edema. JVD difficult to see due to short neck. Pulmonary:     Effort: Pulmonary effort is normal.     Breath sounds: Normal breath sounds.  Abdominal:     General: Bowel sounds are normal.     Palpations: Abdomen is soft.     Comments: Obese. Pannus present    Laboratory examination:   No results for input(s): NA, K, CL, CO2, GLUCOSE, BUN, CREATININE, CALCIUM, GFRNONAA, GFRAA in the last 8760 hours. CrCl cannot be calculated (Patient's most recent lab result is older than the maximum 21 days allowed.).  CMP Latest Ref Rng & Units 02/16/2013 06/21/2009 05/17/2009  Glucose 70 - 99 mg/dL 96 152(H) 160(H)  BUN 6 - 23 mg/dL _3 Creatinine 0.50 - 1.10 mg/dL 0.76 0.8 0.92  Sodium 137 - 147 mEq/L 139 139 139  Potassium 3.7 - 5.3 mEq/L 4.7 3.9 3.9  Chloride 96 - 112 mEq/L 101 109 108  CO2 19 - 32 mEq/L 25 - 24  Calcium 8.4 - 10.5 mg/dL 9.7 - 9.1   CBC Latest Ref Rng & Units 02/16/2013 01/10/2013 06/21/2009  WBC 4.0 - 10.5 K/uL 8.7 9.2 -  Hemoglobin 12.0 - 15.0 g/dL 14.1 13.8 13.6  Hematocrit 36.0 - 46.0 % 42.2 42.8 40.0  Platelets 150 - 400 K/uL 313 315 -    Lipid Panel No results for input(s): CHOL, TRIG, LDLCALC, VLDL, HDL, CHOLHDL, LDLDIRECT in the last 8760 hours.  HEMOGLOBIN A1C No results found for: HGBA1C, MPG TSH No results for input(s): TSH in the last 8760 hours.  External labs:   Cholesterol, total 345.000 m 01/04/2020 HDL 42.000 mg 01/04/2020 LDL 255.000 m 01/04/2020 Triglycerides 242.000 m 01/04/2020 NHDL 303  A1C 7.000 % 01/04/2020 TSH 2.760 01/04/2020   Hemoglobin 13.900 g/d 08/10/2019 PLTS 268  Creatinine, Serum 0.800 mg/ 08/10/2019 Potassium 4.600 mEq 01/04/2020 EGFR 78 ALT (SGPT) 45.000 IU/ 01/04/2020   Medications and allergies   Allergies  Allergen Reactions  . Advil [Ibuprofen] Shortness Of Breath    Can tolerate Alleve OK. Can tolerate Alleve OK.  Elmer Bales Meal   .  Fluorouracil   . Other Nausea Only    Honeydew and Cantaloupe     Outpatient Medications Prior to Visit  Medication Sig Dispense Refill  . diphenhydrAMINE-APAP, sleep, (TYLENOL PM EXTRA STRENGTH PO) Take 1 tablet by mouth  2 (two) times daily.    . furosemide (LASIX) 20 MG tablet Take 20 mg by mouth daily as needed.    . Loratadine (CLARITIN PO) Take 10 mg by mouth daily.     . medroxyPROGESTERone (DEPO-PROVERA) 150 MG/ML injection Inject 1 mL (150 mg total) into the muscle every 3 (three) months. Every 3 months 1 mL 3  . meloxicam (MOBIC) 7.5 MG tablet Take 1 tablet by mouth daily.    . metFORMIN (GLUCOPHAGE) 500 MG tablet Take 500 mg by mouth daily.    . montelukast (SINGULAIR) 10 MG tablet Take 10 mg by mouth at bedtime.    . Turmeric (QC TUMERIC COMPLEX PO) Take 1 tablet by mouth at bedtime.    . hydrochlorothiazide (HYDRODIURIL) 12.5 MG tablet Take 12.5 mg by mouth daily.     No facility-administered medications prior to visit.    Radiology:   No results found.  Cardiac Studies:   Left Lower extremity venous duplex 10/25/2019: - There is no evidence of deep vein thrombosis in the lower extremity. However, portions of this examination were limited- see technologist comments above. - No cystic structure found in the popliteal fossa.   EKG:     EKG 04/04/2020: Sinus tachycardia at rate of _0 bpm, leftward axis, incomplete right bundle branch block.  Poor R wave progression, cannot exclude anteroseptal infarct old.  Baseline artifact.  Compared to 07/13/2019, no significant change.  Assessment     ICD-10-CM   1. Dyspnea on exertion  R06.00 EKG 12-Lead    Brain natriuretic peptide    PCV ECHOCARDIOGRAM COMPLETE    spironolactone (ALDACTONE) 25 MG tablet    Basic metabolic panel  2. Hypercholesteremia  E78.00 atorvastatin (LIPITOR) 40 MG tablet    Lipid Panel With LDL/HDL Ratio  3. Class 3 severe obesity due to excess calories with serious comorbidity and body mass  index (BMI) greater than or equal to 70 in adult (HCC)  E66.01    Z68.45   4. OSA on CPAP  G47.33    Z99.89   5. Type 2 diabetes mellitus without complication, without long-term current use of insulin (HCC)  E11.9   6. Primary hypertension  I10   7. Chronic diastolic heart failure (HCC)  I50.32 furosemide (LASIX) 20 MG tablet    spironolactone (ALDACTONE) 25 MG tablet    Medications Discontinued During This Encounter  Medication Reason  . hydrochlorothiazide (HYDRODIURIL) 12.5 MG tablet Error    Meds ordered this encounter  Medications  . spironolactone (ALDACTONE) 25 MG tablet    Sig: Take 1 tablet (25 mg total) by mouth every morning.    Dispense:  30 tablet    Refill:  2  . atorvastatin (LIPITOR) 40 MG tablet    Sig: Take 1 tablet (40 mg total) by mouth daily.    Dispense:  30 tablet    Refill:  2   Orders Placed This Encounter  Procedures  . Brain natriuretic peptide  . Basic metabolic panel  . Lipid Panel With LDL/HDL Ratio    Standing Status:   Future    Standing Expiration Date:   04/04/2021  . EKG 12-Lead  . PCV ECHOCARDIOGRAM COMPLETE    Standing Status:   Future    Standing Expiration Date:   04/04/2021   Recommendations:   ROSELIA SNIPE is a 45 y.o. Caucasian female with long-term history of obesity, has had significant weight loss but has gained it back several times, she is a mother of  twins who are 17 years of age, hypertension, hyperlipidemia, OSA on CPAP, diabetes mellitus who was referred to me for evaluation of dyspnea on exertion.  Patient states that over the last few weeks she has suddenly noticed rapid onset of dyspnea even doing minimal activities.  States that she was markedly dyspneic even walking from parking lot to here.  Denies any worsening leg edema although she does take Lasix for mild chronic leg edema left worse than the right.  Denies any leg discomfort or painful swelling of the lower extremity although she does have arthritis.  No chest pain or  palpitations.  She has been compliant with her CPAP.  Patient has dyspnea and exertion of multifactorial etiology including obesity hypoventilation, hypertension, chronic diastolic heart failure.  Will obtain echocardiogram to establish baseline.  We will also start the patient on spironolactone 25 mg in the morning, fluid restriction and salt restriction discussed with the patient along with calorie restriction.  We will obtain a BMP and BNP in 1 week to 10 days.  With regard to hyperlipidemia, we will start her on 40 mg of atorvastatin and check lipids in 6 weeks.  It appears that she has had coronary calcium score done, I will follow up on this.  I will consider either stress testing or even right and left heart catheterization as we go along depending upon echocardiogram and symptoms.  Patient [facility] and is trying her best to lose weight.  Previously she had lost about 160 pounds or 180 pounds by diet and exercise.  Now degenerative joint disease a statin and it is becoming much harder on her.  She probably should consider bariatric surgery.  I will see her back in 2 months and make a recommendation.  She will continue to monitor her blood pressure, blood pressure is usually well controlled at home but was markedly elevated today.  Probably related to stress from walking from the parking lot.    Adrian Prows, MD, [facility] 04/04/2020, 5:42 PM Office: [phone]

## 2020-04-04 NOTE — Patient Instructions (Signed)

## 2020-04-10 ENCOUNTER — Other Ambulatory Visit: Payer: Self-pay

## 2020-04-10 ENCOUNTER — Ambulatory Visit: Payer: BC Managed Care – PPO

## 2020-04-10 DIAGNOSIS — R0609 Other forms of dyspnea: Secondary | ICD-10-CM | POA: Diagnosis not present

## 2020-04-10 DIAGNOSIS — R0600 Dyspnea, unspecified: Secondary | ICD-10-CM

## 2020-04-12 DIAGNOSIS — R0600 Dyspnea, unspecified: Secondary | ICD-10-CM | POA: Diagnosis not present

## 2020-04-13 LAB — BASIC METABOLIC PANEL
BUN/Creatinine Ratio: 18 (ref 9–23)
BUN: 17 mg/dL (ref 6–24)
CO2: 21 mmol/L (ref 20–29)
Calcium: 9.8 mg/dL (ref 8.7–10.2)
Chloride: 102 mmol/L (ref 96–106)
Creatinine, Ser: 0.94 mg/dL (ref 0.57–1.00)
Glucose: 143 mg/dL — ABNORMAL HIGH (ref 65–99)
Potassium: 5 mmol/L (ref 3.5–5.2)
Sodium: 140 mmol/L (ref 134–144)
eGFR: 77 mL/min/{1.73_m2} (ref >59)

## 2020-04-13 LAB — BRAIN NATRIURETIC PEPTIDE: BNP: 59 pg/mL (ref 0.0–100.0)

## 2020-04-13 NOTE — Progress Notes (Signed)
Normal BNP, this suggests no active heart failure.  BMP is normal, normal renal function, blood sugar is elevated in diabetic range.

## 2020-04-16 DIAGNOSIS — E119 Type 2 diabetes mellitus without complications: Secondary | ICD-10-CM | POA: Diagnosis not present

## 2020-04-16 NOTE — Telephone Encounter (Signed)
From pt

## 2020-04-23 DIAGNOSIS — I462 Cardiac arrest due to underlying cardiac condition: Secondary | ICD-10-CM | POA: Diagnosis not present

## 2020-04-23 DIAGNOSIS — R0602 Shortness of breath: Secondary | ICD-10-CM | POA: Diagnosis not present

## 2020-04-23 DIAGNOSIS — R0902 Hypoxemia: Secondary | ICD-10-CM | POA: Diagnosis not present

## 2020-04-23 DIAGNOSIS — Z6841 Body Mass Index (BMI) 40.0 and over, adult: Secondary | ICD-10-CM | POA: Diagnosis not present

## 2020-04-23 DIAGNOSIS — J80 Acute respiratory distress syndrome: Secondary | ICD-10-CM | POA: Diagnosis not present

## 2020-04-23 DIAGNOSIS — I5020 Unspecified systolic (congestive) heart failure: Secondary | ICD-10-CM | POA: Diagnosis not present

## 2020-04-23 DIAGNOSIS — R404 Transient alteration of awareness: Secondary | ICD-10-CM | POA: Diagnosis not present

## 2020-04-25 DIAGNOSIS — G4733 Obstructive sleep apnea (adult) (pediatric): Secondary | ICD-10-CM | POA: Diagnosis not present

## 2020-05-03 DEATH — deceased

## 2020-06-07 ENCOUNTER — Ambulatory Visit: Payer: BC Managed Care – PPO | Admitting: Cardiology

## 2020-11-20 IMAGING — CT CT HEART SCORING
3 series · 14 of 20 positions shown, 16 images · non-contrast
Comparison: None.

CLINICAL DATA: 43-year-old white female

EXAM:
CT HEART FOR CALCIUM SCORING
TECHNIQUE: CT heart was performed on a 256 channel system using prospective ECG
gating. A scout and noncontrast exam (for calcium scoring) were
performed. Note that this exam targets the heart and the chest was
not imaged in its entirety.

[Series 2: calcium scoring 2.00 qr36 bestdiast 66% · axial · 0.40mm/px · z∈[+1790,+1862]mm · 4 of 60 slices shown]
[im 12/60  vessel]
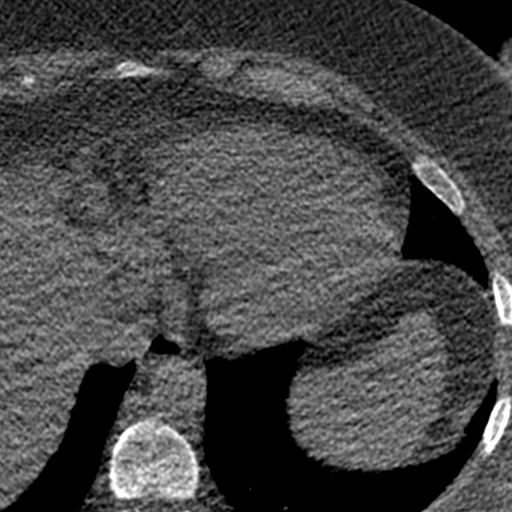
[im 24/60  vessel]
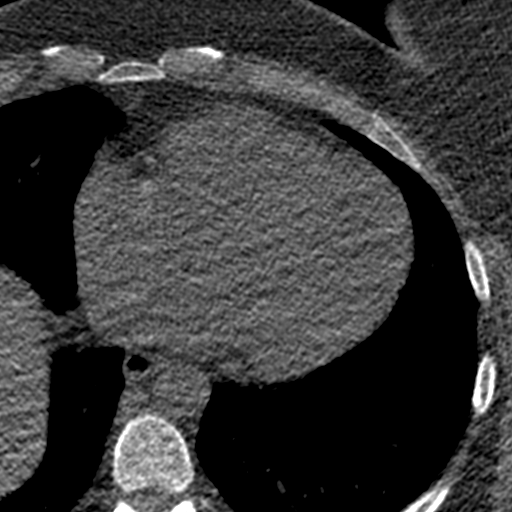
[im 36/60  vessel]
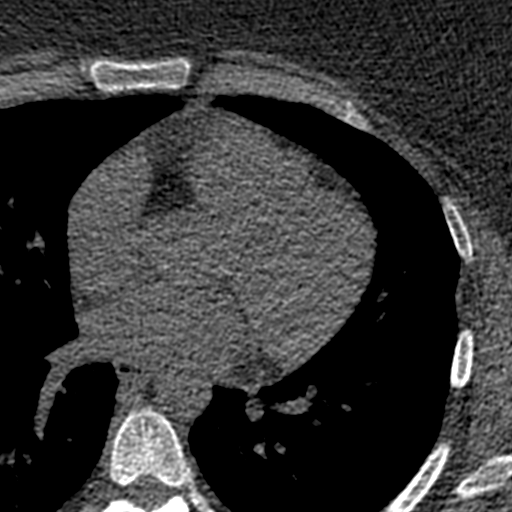
[im 48/60  vessel]
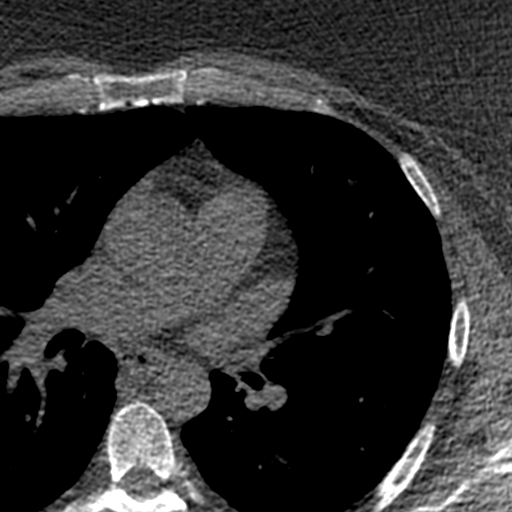

[Series 3: calcium scoring 2.00 br40 bestdiast 66% fov · axial · 0.73mm/px · z∈[+1786,+1866]mm · 5 of 60 slices shown, 7 images]
[im 10/60  vessel]
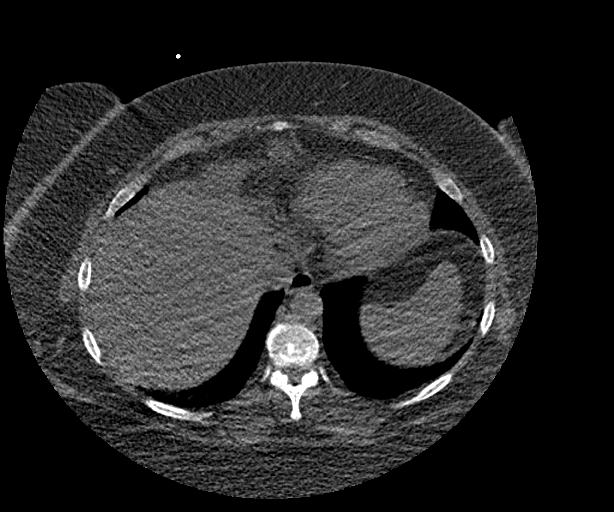
[im 10/60  lung]
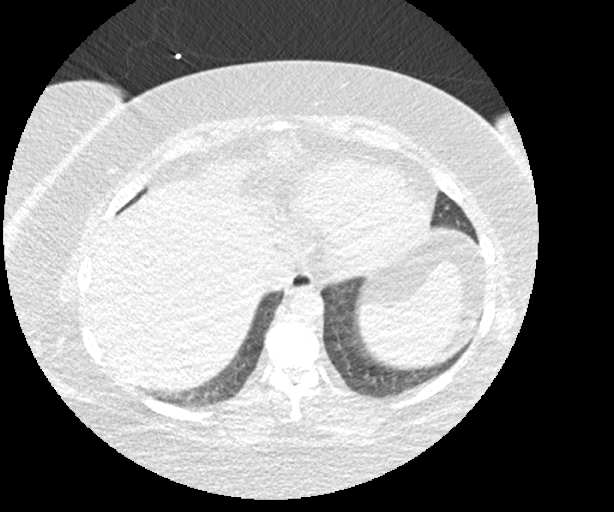
[im 20/60  vessel]
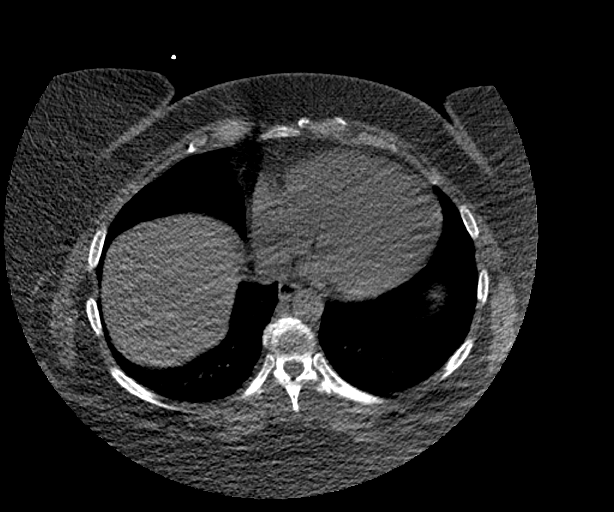
[im 30/60  vessel]
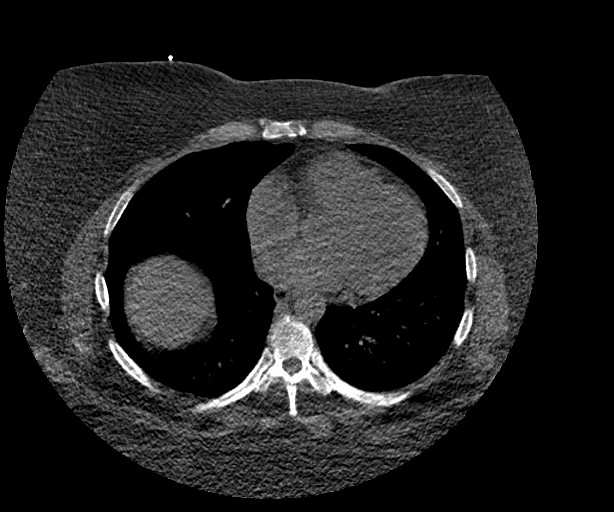
[im 40/60  vessel]
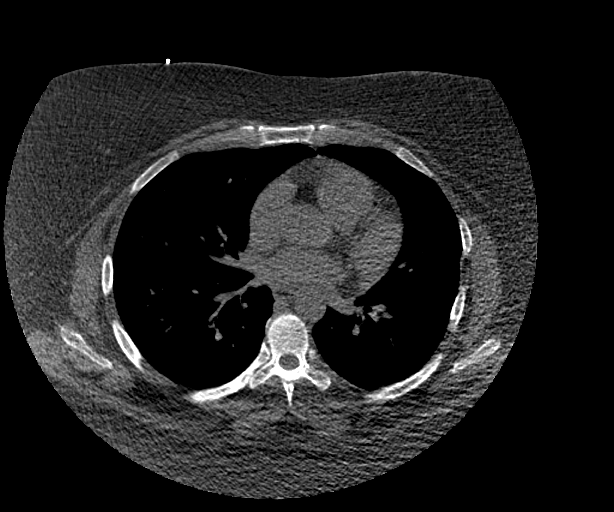
[im 50/60  vessel]
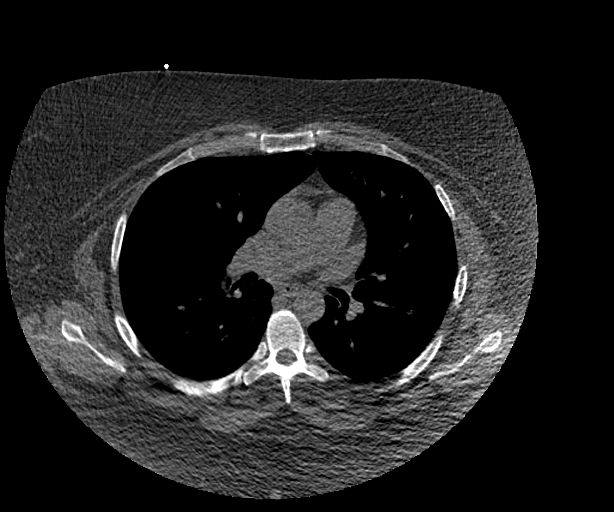
[im 50/60  lung]
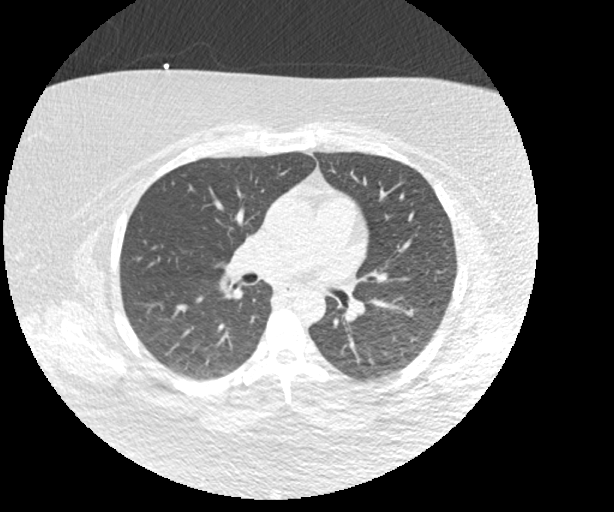

[Series 9: calcium scoring 2.00 br60 bestdiast 66% fov · axial · 0.73mm/px · z∈[+1786,+1866]mm · 5 of 60 slices shown]
[im 10/60  vessel]
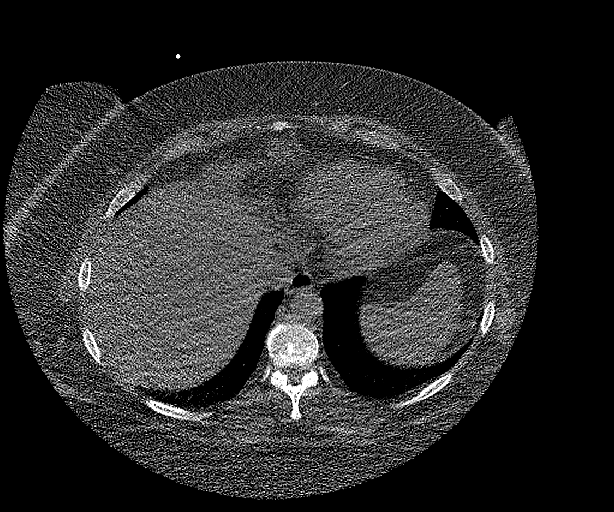
[im 20/60  vessel]
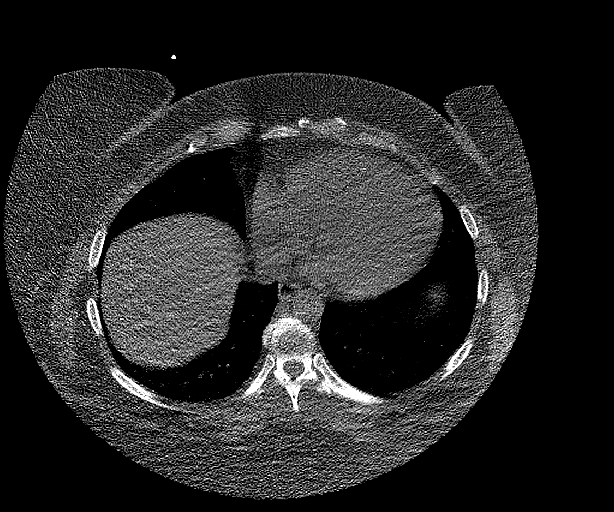
[im 30/60  vessel]
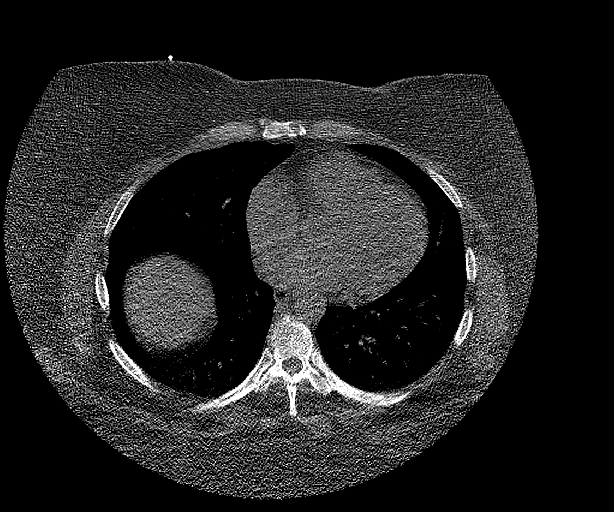
[im 40/60  vessel]
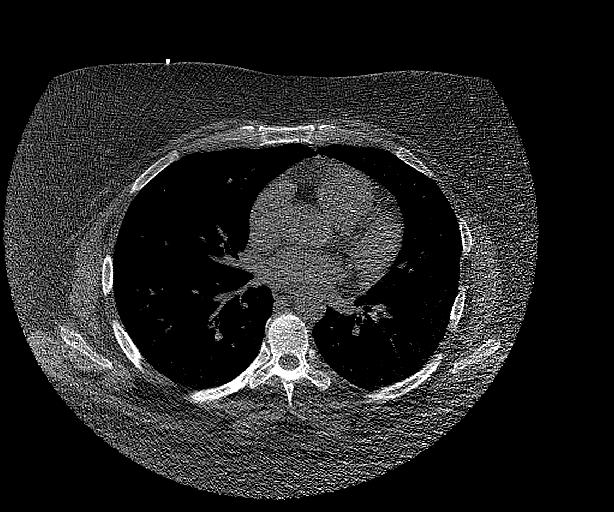
[im 50/60  vessel]
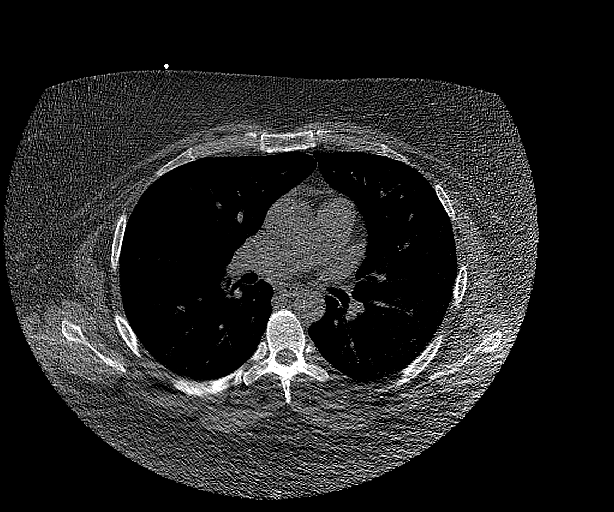

[14 of 20 positions shown; findings below may reference images not displayed]

FINDINGS: Technical quality: Good

No coronary artery calcification.

CORONARY CALCIUM

Total Agatston Score: 0

[HOSPITAL] percentile: 0 th

Ascending aorta ( <  40 mm): 34 mm

EXTRACARDIAC FINDINGS:

Limited view of the lung parenchyma demonstrates no suspicious
nodularity. Airways are normal.

Limited view of the mediastinum demonstrates no adenopathy.
Esophagus normal.

Limited view of the upper abdomen unremarkable.

Limited view of the skeleton and chest wall is unremarkable.
IMPRESSION: 1. No coronary artery calcification.

2. Total Agatston Score: 0

3. MESA age and sex matched database percentile: 0th

## 2020-12-31 ENCOUNTER — Encounter: Payer: BC Managed Care – PPO | Admitting: Nurse Practitioner

## 2021-08-19 DIAGNOSIS — R61 Generalized hyperhidrosis: Secondary | ICD-10-CM | POA: Insufficient documentation

## 2021-08-28 DIAGNOSIS — N951 Menopausal and female climacteric states: Secondary | ICD-10-CM | POA: Insufficient documentation

## 2021-09-09 ENCOUNTER — Telehealth: Payer: Self-pay | Admitting: Family

## 2021-09-09 ENCOUNTER — Telehealth: Payer: No Typology Code available for payment source | Admitting: Family

## 2021-09-09 DIAGNOSIS — N946 Dysmenorrhea, unspecified: Secondary | ICD-10-CM

## 2021-09-09 NOTE — Telephone Encounter (Signed)
See My chart message

## 2021-09-09 NOTE — Progress Notes (Signed)
Distant Site Telemedicine Encounter  I conducted this encounter via secure, live, face-to-face video conference with the patient. I reviewed the risks and benefits of telemedicine as pertinent to this visit and the patient agreed to proceed.    Provider Location: Off-site location (home, non-[place] location)  Patient Location: At home  Present with patient: No one else present            Subjective:     Nicole [name] is a 46 year old adult who presents with dysmenorrhea.    Nicole [name] has a long history of dysmenorrhea. In past some concern for endometriosis, but discomfort well-controlled with oral contraceptives so no further evaluation done.     Approx 1-2 years ago she was switched to a different oral contraceptive and since that time her uterine cramping has worsened. She takes the oral contraceptives continuously so does not have a regular menses but will have breakthrough bleeding at times. Last week she did have some vaginal bleeding, cramping was very severe and kept her from activities she would usually do. She has most recently had a PCP at [facility] and when spoke to the PCP recently was put on Paxil. Although she thinks the Paxil may be helpful for vasomotor premenopausal symptoms she did not feel the provider addressed her menstrual cramping.     She has scheduled an appt with a [facility] in 69mhs. Scheduled telemedicine visit with Virtual Care team today to discuss symptoms earlier since discomfort is so severe.     Of note, pain is improved this week from last but persists. She rates at a 9/10 last week, now closer to 5-6/10.     Only female sexual partner. No female partner since she was in her 255s    No history abnormal cervical cancer screening. She is unsure when last done, was told likely did not need to have any further.     Hx gallbladder removal    This pain is very consistent with her previous uterine cramping.   No fever, chills, or systemic symptoms     Objective:    Vitals: There were no vitals  taken for this visit.    General: A/Ox4, appears to be in mild discomfort but in no distress and converses normally and easily  Resp: Normal resp rate and rhythm, no distress  Psych: Follows conversation easily, no rapid speech         Assessment and Plan:    Nicole [name] seen today for menstrual problem.    Dysmenorrhea  We discussed switching her current oral contraceptives to a different hormone level that may help her discomfort more, but reviewed current COC dosage after visit (Lovera 0.15/30) as well as current literature and increasing to [place] higher level of estrogen likely not helpful and could lead to increased cardiac risk. I will send her a My Chart message with this info and she plans to review my note also.     More importantly I am ordering her a Transvaginal Pelvic [place]ato evaluate her uterus and ovaries. Concern for fibroids or other uterine disorder which is causing her this increased cramping.     During our visit she stated that [facility], but upon chart review later in the day I note the visit is now scheduled for one week from today (8/15). I'll put an urgent referral in also just to do our best to have her be seen earlier than 50m43m from now.  Anticipate Gyn will review [place] with her. I will likely get results also and will notify her of results if not already done by Gyn.     In the meantime she is to be seen in Urgent Care if fever, increasing pain that is not managed by home care, excessive vaginal bleeding, or other acute concerns. ER if Urgent Care not available.    I considered other abdominal diagnoses such as appendicitis, UTI, pyelonephritis, intestinal blockage, etc but symptoms very consistent with her previous uterine cramping and I do not think further evaluation in ER or urgent care is needed today. She is in agreement.

## 2021-09-10 NOTE — Telephone Encounter (Signed)
Please see mychart message and advise. Thank you!    Looked at note and did not see a discussion on a referral to primary care. Also unsure if they need a referral to establish care with a provider.

## 2021-09-10 NOTE — Telephone Encounter (Signed)
Pt notified and given link

## 2021-09-10 NOTE — Telephone Encounter (Signed)
Best course of action would be to establish with PCP and from PCP would be referrals to specialist.  I know that is not a speedy process and I'm sorry.    No need for referral to PCP.  There is a "how to establish care with PCP"  AVS I will find and send separately.    Hanley Hays  [facility]  09/10/21

## 2021-09-10 NOTE — Telephone Encounter (Signed)
How to establish care with a PCP:   You can find a list of our Primary Care Providers who are accepting new patients by visiting our website at [place] in [facility]: [address] [place] [facility] Home and Work  [place] and clicking on your preferred location.  Or one of your Virtual Care Team members can assist you with finding the location of your choice and providing you with a list of providers that are currently accepting new patients.   Our team is happy to assist you in getting an appointment scheduled to establish care with [place], please contact us at [phone] or at our main number at [phone].

## 2021-09-10 NOTE — Telephone Encounter (Signed)
Patient was advised to make another appointment if wanting to discuss additional referrals.

## 2021-09-11 NOTE — Telephone Encounter (Signed)
See my note from [place] 8/8

## 2021-09-12 NOTE — Telephone Encounter (Addendum)
Please help patient schedule at her requested clinic, she is not considered an established patient once she has seen [place] so you should be able to get her scheduled sooner.    Thanks!    Abdikadir Fohl ARNP

## 2021-09-12 NOTE — Telephone Encounter (Signed)
Pt prefers [facility] or [facility], spoke to [place] they only have one provider accepting new pts and they have no openings until February.     [facility] spoke to [place], she said that they do not any slots for a Establish care until December but possibly might have openings for a new provider second week of September and the pt can try reaching them to make an appointment in the meantime if pt's foot pain is getting worse she can be seen in urgent care and they can possibly if her a referral.

## 2021-09-12 NOTE — Telephone Encounter (Signed)
Routing to provider to advise for next best step.

## 2021-09-15 NOTE — Progress Notes (Signed)
CC:  Irregular bleeding     S) Nicole [name] 46 year old No obstetric history on file. is here with complaints of:     Severe dysmenorrhea and menorrhagia. Continue having cramps despite amenorrhea by birthcontrol. Pain worst with period, had BTB 7/15 and has continued to have dull pain. During first week of BTB, experienced 9/10 pain where she was doubled over and couldn't function in normal everyday activities.   Had pelvic US earlier today which has exacerbated her pain.   Menarche at age 51.  Typically, cycles are monthly, flows last under 7 days. Typically has 4-5 days of cramps and 4-5 heavy day.         Not currently sexually active.    Outpatient Medications Prior to Visit   Medication Sig Dispense Refill    acetaminophen 500 MG tablet Take 2 tablets (1,000 mg) by mouth.      aspirin-acetaminophen-caffeine 250-250-65 MG tablet Take 1 tablet by mouth.      Calcium Polycarbophil 625 MG chewable tablet Chew and swallow by mouth.      DULoxetine 20 MG DR capsule Take 3 capsules (60 mg) by mouth daily.      hydroCHLOROthiazide 25 MG tablet Take 1 tablet (25 mg) by mouth daily.      levonorgestrel-ethinyl estradiol 0.15-30 MG-MCG tablet Take 1 tablet by mouth daily.      levothyroxine 175 MCG tablet Take 1 tablet (175 mcg) by mouth daily.      losartan 50 MG tablet Take 1 tablet (50 mg) by mouth daily.      norethindrone-ethinyl estradiol 1-35 MG-MCG tablet Take 1 tablet by mouth daily.      omeprazole 40 MG DR capsule Take 1 capsule (40 mg) by mouth daily.      PARoxetine HCl 10 MG tablet Take 1 tablet (10 mg) by mouth at bedtime.      QUEtiapine 25 MG tablet Take 1 tablet (25 mg) by mouth daily.      WELLBUTRIN SR TBCR 150 MG OR TAKE 1 TABLET TWICE DAILY  0     No facility-administered medications prior to visit.       Past Medical History:   Diagnosis Date    Abnormal Papanicolaou smear of cervix and cervical HPV 04/03/1998    secondary to cervicitis-tx and then normal    Anemia     Arthritis     Depression      Dysmenorrhea     NSAIDS    GERD (gastroesophageal reflux disease)     Headache     Hypertension     Migraine     Thyroid disease     Unspecified adjustment reaction     current hx of depression tx with Wellbutrin       Patient Active Problem List   Diagnosis    deferred until full assessment id done    Major depressive disorder, single episode, in partial or unspecified remission    Pain of right hand    Wrist tendonitis    Dysmenorrhea       O)  Vitals:    09/16/21 1309   BP: 130/80   BP Cuff Size: Large   BP Site: Left Arm   BP Position: Sitting   Weight: (!) 127.9 kg (282 lb)   Height: '5\' 9"'$  (1.753 m)       Constitutional:  Body mass index is 41.64 kg/m.  Pt does not appear in acute distress.  Pt has excellent communications.  Neurological/Psychological - brief assessment of mental status including:   Mood and affect: pt did not appear to be anxious or depressed. Does not appear irritable.  Appears oriented to time, place and person.    Imaging Results:  US Pelvis Complete  W Kayren Eaves  Narrative: Indication  ========  Uterine cramping - r/o fibroids, other changes in uterine anatomy. Female partner only - low/no risk of pregnancy.  LMP: 08/16/2021       Method  ======  Transabdominal and transvaginal ultrasound examination was performed to evaluate pelvic structures. View: Suboptimal view: restricted by patient body habitus.       Uterus  ======  Uterus details: The included length and volume are of the uterine corpus only and do not include the cervix. The uterus is normal in size and shape.  Uterus position: anteverted  Description of uterine malformations: none  Myometrium: Multiple fibroids seen,  Endometrium: within normal limits  Cervix details: Echogenic mass of the anterior cervix wall with internal vascular flow, measuring 1.6 x 0.6 x 0.5 cm  Uterus length 52 mm  Uterus width 37 mm  Uterus height 30 mm  Uterus Vol 30.3 cm  Endometrial thickness, total 2.2 mm  Fibroids: Fibroids  identified  Uterine fibroid D1 4 mm  Uterine fibroid D2 7 mm  Uterine fibroid D3 2 mm  Uterine fibroid mean 4.3 mm  Uterine fibroid vol 0.029 cm  Doppler:  color score 1 (no color)  Uterine fibroids findings: Subserosal  Uterine fibroid D1 17 mm  Uterine fibroid D2 17 mm  Uterine fibroid D3 14 mm  Uterine fibroid mean 16.0 mm  Uterine fibroid vol 2.118 cm  Doppler:  color score 2 (minimal color)  Uterine fibroids findings: Subserosal, fundal       Right Ovary  =========  Rt ovary: Not visualized       Left Ovary  ========  Lt ovary: Not visualized       Cul de Sac  =========  No free fluid visualized       Impression: =========  2 small subserosal fundal fibroids that do not abut or displace the endometrium.  Echogenic mass anterior cervical wall with flow within it possibly small polyp.  Nonvisualized ovaries.  No free fluid.       Cornelia Copa Attending                    Vernon Prey Sonographer  I have personally reviewed the images and agree with the report (or as edited).    Electronically signed by Cornelia Copa at 9:50 AM on 09/16/2021        A)    Dysfunctional uterine bleeding    P)    Reviewed common reasons for bleeding: hormonal changes, structural changes, medication side effects, stress.  Pt can choose to watch and wait to see how bleeding continues.  Advised MVA with iron daily.  If bleeding last longer than 7 days, she is to call for advise.  Referral to OBGYN MD for hysteroscopy.  Rx for ketorolac for severe abd cramping.  RTC or call PRN    I spent a total of 25 minutes for the patient's care on the date of the service.         [place], [place]  12:54 PM; 09/15/2021

## 2021-09-16 ENCOUNTER — Ambulatory Visit (INDEPENDENT_AMBULATORY_CARE_PROVIDER_SITE_OTHER): Payer: No Typology Code available for payment source

## 2021-09-16 ENCOUNTER — Encounter (INDEPENDENT_AMBULATORY_CARE_PROVIDER_SITE_OTHER): Payer: Self-pay

## 2021-09-16 ENCOUNTER — Ambulatory Visit
Admission: RE | Admit: 2021-09-16 | Discharge: 2021-09-16 | Disposition: A | Discharge status: Home / Self Care | Payer: No Typology Code available for payment source | Attending: Diagnostic Radiology | Admitting: Diagnostic Radiology

## 2021-09-16 VITALS — BP 130/80 | Ht 69.0 in | Wt 282.0 lb

## 2021-09-16 DIAGNOSIS — R7982 Elevated C-reactive protein (CRP): Secondary | ICD-10-CM

## 2021-09-16 DIAGNOSIS — N946 Dysmenorrhea, unspecified: Secondary | ICD-10-CM | POA: Insufficient documentation

## 2021-09-16 DIAGNOSIS — M791 Myalgia, unspecified site: Secondary | ICD-10-CM

## 2021-09-16 MED ORDER — KETOROLAC TROMETHAMINE 10 MG OR TABS
10.0000 mg | ORAL_TABLET | Freq: Four times a day (QID) | ORAL | 0 refills | Status: AC | PRN
Start: 2021-09-16 — End: ?

## 2021-09-16 NOTE — Result Encounter Note (Signed)
Appointment with OBGYN today.

## 2021-09-17 NOTE — Telephone Encounter (Signed)
Do you mind calling her to coordinate an EMB with me and review the procedure with her? Thank you!

## 2021-09-18 NOTE — Telephone Encounter (Signed)
Called and left VM - offered the 44mn appt with [facility] at 1130. Also, requested she take her Toradol 358m prior to whatever appointment time she get, [facility]

## 2021-09-19 ENCOUNTER — Ambulatory Visit (INDEPENDENT_AMBULATORY_CARE_PROVIDER_SITE_OTHER): Payer: No Typology Code available for payment source

## 2021-09-19 ENCOUNTER — Ambulatory Visit
Payer: No Typology Code available for payment source | Attending: Student in an Organized Health Care Education/Training Program

## 2021-09-19 VITALS — BP 124/82 | HR 84 | Wt 284.0 lb

## 2021-09-19 DIAGNOSIS — N939 Abnormal uterine and vaginal bleeding, unspecified: Secondary | ICD-10-CM

## 2021-09-19 DIAGNOSIS — N926 Irregular menstruation, unspecified: Secondary | ICD-10-CM

## 2021-09-19 DIAGNOSIS — Z3200 Encounter for pregnancy test, result unknown: Secondary | ICD-10-CM

## 2021-09-19 DIAGNOSIS — R9389 Abnormal findings on diagnostic imaging of other specified body structures: Secondary | ICD-10-CM

## 2021-09-19 NOTE — Progress Notes (Signed)
ENDOMETRIAL BIOPSY    Nicole [name] is a 46 year old [place] who presents today for endometrial biopsy.  INDICATION: abnormal uterine bleeding, abnormal ultrasound: cystic structures in lower uterine segment.    Patient's last menstrual period was 08/16/2021.  Urine pregnancy test was done today and result was negative.    The risks (including infection, bleeding, pain, and uterine perforation) and benefits of the procedure were explained to the patient: YES  Costs and insurance coverage discussed: YES  Treatment options reviewed: YES      PROCEDURE NOTE     Correct patient NAME and ID YES   Correct PROCEDURE YES   Correct EQUIPMENT and SETTINGS YES   Completed CONSENT YES, Date: 09/19/2021     The patient was placed in the dorsal lithotomy position.  Bimanual exam showed the uterus to be in the anteverted position.  A speculum was inserted in the vagina, and the cervix prepped with povidone iodine.  Endocervical curettage with a Kevorkian curette was not performed.    A tenaculum was applied to the anterior lip of the cervix for stabilization. The uterus was sounded to a depth of 7 cm.  A Pipelle endometrial aspirator was used to sample the endometrium.  Sample was sent for pathologic examination.    The patient tolerated the procedure well. Hemostasis was noted.  Complications: none    The patient was advised to call for any fever or for prolonged or severe pain or bleeding. She was advised to use NSAID as needed for mild to moderate pain. She was advised to avoid vaginal intercourse for 48 hours or until the bleeding has completely stopped.    Procedure performed by Clinician

## 2021-09-24 LAB — PATHOLOGY, SURGICAL: Clinical Information: ABNORMAL

## 2021-09-24 NOTE — Result Encounter Note (Signed)
-   EMB

## 2021-09-26 ENCOUNTER — Encounter (HOSPITAL_BASED_OUTPATIENT_CLINIC_OR_DEPARTMENT_OTHER): Payer: Self-pay

## 2021-09-26 ENCOUNTER — Encounter (INDEPENDENT_AMBULATORY_CARE_PROVIDER_SITE_OTHER): Payer: Self-pay

## 2021-09-30 ENCOUNTER — Ambulatory Visit (INDEPENDENT_AMBULATORY_CARE_PROVIDER_SITE_OTHER): Payer: No Typology Code available for payment source | Admitting: Obstetrics & Gynecology

## 2021-09-30 ENCOUNTER — Encounter (INDEPENDENT_AMBULATORY_CARE_PROVIDER_SITE_OTHER): Payer: Self-pay | Admitting: Obstetrics & Gynecology

## 2021-09-30 VITALS — BP 128/82 | HR 97 | Wt 288.0 lb

## 2021-09-30 DIAGNOSIS — N841 Polyp of cervix uteri: Secondary | ICD-10-CM

## 2021-09-30 DIAGNOSIS — N946 Dysmenorrhea, unspecified: Secondary | ICD-10-CM

## 2021-09-30 MED ORDER — MEDROXYPROGESTERONE ACETATE 150 MG/ML IM SUSP
150.0000 mg | INTRAMUSCULAR | Status: AC
Start: 2021-09-30 — End: ?
  Administered 2021-09-30 – 2021-12-22 (×2): 150 mg via INTRAMUSCULAR

## 2021-09-30 NOTE — Progress Notes (Signed)
Chief Complaint   Patient presents with    Consultation          Subjective:     Nicole [name] is a 46 year old adult who presents on 09/30/2021.  Nicole [name] is a 46 year old gravida 0 individual who presents today with reports of dysmenorrhea.  They state that they have had a long history of dysmenorrhea.  They have tried Depo in the past which was helpful in achieving amenorrhea.  They had stopped Depo due to desire to conceive.  Now they are on combination OCPs and have breakthrough bleeding.  During episodes of breakthrough bleeding, they have severe pain that is in the lower pelvis radiating to the back.  They state that pain has worsened with the recent ultrasound.  They continue to experience that pain even if they are not having menstrual bleeding.  They have been on continuous OCPs for some time.  They have had an endometrial biopsy which was normal.  They also had a pelvic ultrasound which showed a cervical polyp.  They are wanting to know if undergoing a hysterectomy would alleviate the dysmenorrhea versus removing the polyp.  They state that their pain was significantly exacerbated with the pelvic ultrasound and they were sore and continued to have pain now.    Past medical history: Hypertension, GERD, anxiety and depression on duloxetine  Past surgical history: Tympanoplasty, cholecystectomy  OB history: Gravida 0  GYN history: No history of abnormal Pap.  Has had a colposcopy for a remote abnormal Pap    Study Result    Narrative & Impression   Indication  ========  Uterine cramping - r/o fibroids, other changes in uterine anatomy. Female partner only - low/no risk of pregnancy.  LMP: 08/16/2021        Method  ======  Transabdominal and transvaginal ultrasound examination was performed to evaluate pelvic structures. View: Suboptimal view: restricted by patient body habitus.        Uterus  ======  Uterus details:  The included length and volume are of the uterine corpus only and do not include the cervix. The  uterus is normal in size and shape.  Uterus position:           anteverted  Description of uterine malformations:            none  Myometrium:   Multiple fibroids seen,  Endometrium:  within normal limits  Cervix details:  Echogenic mass of the anterior cervix wall with internal vascular flow, measuring 1.6 x 0.6 x 0.5 cm  Uterus length   52 mm  Uterus width    37 mm  Uterus height   30 mm  Uterus Vol        30.3 cm  Endometrial thickness, total    2.2 mm  Fibroids:           Fibroids identified  Uterine fibroid D1       4 mm  Uterine fibroid D2       7 mm  Uterine fibroid D3       2 mm  Uterine fibroid mean   4.3 mm  Uterine fibroid vol       0.029 cm  Doppler:  color score 1 (no color)  Uterine fibroids findings:         Subserosal  Uterine fibroid D1       17 mm  Uterine fibroid D2       17 mm  Uterine fibroid D3  14 mm  Uterine fibroid mean   16.0 mm  Uterine fibroid vol       2.118 cm  Doppler:  color score 2 (minimal color)  Uterine fibroids findings:         Subserosal, fundal        Right Ovary  =========  Rt ovary:          Not visualized        Left Ovary  ========  Lt ovary:           Not visualized        Cul de Sac  =========  No free fluid visualized        IMPRESSION  =========  2 small subserosal fundal fibroids that do not abut or displace the endometrium.  Echogenic mass anterior cervical wall with flow within it possibly small polyp.  Nonvisualized ovaries.  No free fluid.        Objective:    Vitals: BP 128/82   Pulse 97   Wt (!) 130.6 kg (288 lb)   LMP 08/16/2021   SpO2 97%   BMI 42.53 kg/m   Physical Exam  Awake alert oriented no acute distress  Unlabored breathing  Abdomen no rebound or guarding  GYN exam not done       Assessment and Plan:      Dysmenorrhea: On reviewed ultrasound, discussed that there is a small polyp and small subserosal fibroids.  It is likely that patient is experiencing pain with breakthrough bleeding however having continued pain when not having a  menstrual cycle has an unclear etiology.  The endometrium was benign.  We discussed the implications of hysteroscopy to remove the endocervical polyp and look within the endometrial cavity and a hysterectomy.  We discussed the consideration that removal of the uterus may not completely alleviate all pain.  It can alleviate the dysmenorrhea.  There is also recovery considerations.  Given that patient had good symptom control on Depo and they are not wanting to conceive then reasonable to return to Depo and undergo the endocervical polyp removal.  If the combination of these 2 things helps alleviate the symptoms of pain then these interventions have a lower risk in comparison to undergoing a hysterectomy.  Patient would like to start Depo today.  We will schedule for a hysteroscopy polypectomy.  Anesthesia questionnaire and surgical consents were signed today      Study Result    Narrative & Impression   Indication  ========  Uterine cramping - r/o fibroids, other changes in uterine anatomy. Female partner only - low/no risk of pregnancy.  LMP: 08/16/2021        Method  ======  Transabdominal and transvaginal ultrasound examination was performed to evaluate pelvic structures. View: Suboptimal view: restricted by patient body habitus.        Uterus  ======  Uterus details:  The included length and volume are of the uterine corpus only and do not include the cervix. The uterus is normal in size and shape.  Uterus position:           anteverted  Description of uterine malformations:            none  Myometrium:   Multiple fibroids seen,  Endometrium:  within normal limits  Cervix details:  Echogenic mass of the anterior cervix wall with internal vascular flow, measuring 1.6 x 0.6 x 0.5 cm  Uterus length   52 mm  Uterus width    37  mm  Uterus height   30 mm  Uterus Vol        30.3 cm  Endometrial thickness, total    2.2 mm  Fibroids:           Fibroids identified  Uterine fibroid D1       4 mm  Uterine fibroid D2       7  mm  Uterine fibroid D3       2 mm  Uterine fibroid mean   4.3 mm  Uterine fibroid vol       0.029 cm  Doppler:  color score 1 (no color)  Uterine fibroids findings:         Subserosal  Uterine fibroid D1       17 mm  Uterine fibroid D2       17 mm  Uterine fibroid D3       14 mm  Uterine fibroid mean   16.0 mm  Uterine fibroid vol       2.118 cm  Doppler:  color score 2 (minimal color)  Uterine fibroids findings:         Subserosal, fundal        Right Ovary  =========  Rt ovary:          Not visualized        Left Ovary  ========  Lt ovary:           Not visualized        Cul de Sac  =========  No free fluid visualized        IMPRESSION  =========  2 small subserosal fundal fibroids that do not abut or displace the endometrium.  Echogenic mass anterior cervical wall with flow within it possibly small polyp.  Nonvisualized ovaries.  No free fluid.

## 2021-09-30 NOTE — Progress Notes (Signed)
Medication given: Depo-Provera  Expiration date: 03/2023  Lot number: [id number]  Concentration: 1 mL  Volume administered: 1 mL  Injection site: left deltoid    NEXT DEPO DUE: 11/14 - 11/18

## 2021-10-03 NOTE — Progress Notes (Signed)
Visit: Est Care, low potassium x1 year,     Refills? NO  Referral? NO  Letter or Form? NO  Lab Results? NO    HEALTH MAINTENANCE:     Cervical screening/PAP: N/A  Mammo: No - Order Pended  Colon Screen: Referral Pending  Diabetic Eye Exam (If applicable): N/A    Have you seen a specialist since your last visit: No    Vaccines Due? Unknown    Does patient have eCare?  yes    HM Due:   Health Maintenance   Topic Date Due    Hepatitis C Screening  Never done    Hepatitis A Vaccine (1 of 2 - Risk 2-dose series) Never done    Cervical Cancer Screening  06/24/2006    Lipid Disorders Screening  Never done    Breast Ca Screening Education 40-49  01/23/2019    COVID-19 Vaccine (4 - Moderna series) 02/28/2020    Colorectal Cancer Screening  Never done    Hepatitis B Vaccine (2 of 3 - 19+ 3-dose series) 06/10/2021    Depression Monitoring (PHQ-9)  09/10/2021    Influenza Vaccine (1) 11/02/2021    Diabetes Screening  10/06/2022    DTaP, Tdap and Td Vaccines (3 - Td or Tdap) 05/14/2031    HIV Screening  Completed    Pneumococcal Vaccine: Pediatrics (0-5 years) and At-Risk Patients (6-64 years)  Aged Out       PCP Verified?  Yes, Cufley, Estelle Grumbles, MD

## 2021-10-04 ENCOUNTER — Encounter (INDEPENDENT_AMBULATORY_CARE_PROVIDER_SITE_OTHER): Payer: Self-pay | Admitting: Nurse Practitioner

## 2021-10-04 ENCOUNTER — Ambulatory Visit (INDEPENDENT_AMBULATORY_CARE_PROVIDER_SITE_OTHER): Payer: No Typology Code available for payment source | Admitting: Nurse Practitioner

## 2021-10-04 VITALS — BP 130/86 | HR 77

## 2021-10-04 DIAGNOSIS — E890 Postprocedural hypothyroidism: Secondary | ICD-10-CM

## 2021-10-04 DIAGNOSIS — Z1231 Encounter for screening mammogram for malignant neoplasm of breast: Secondary | ICD-10-CM

## 2021-10-04 DIAGNOSIS — N951 Menopausal and female climacteric states: Secondary | ICD-10-CM

## 2021-10-04 DIAGNOSIS — E876 Hypokalemia: Secondary | ICD-10-CM

## 2021-10-04 DIAGNOSIS — F418 Other specified anxiety disorders: Secondary | ICD-10-CM

## 2021-10-04 DIAGNOSIS — M797 Fibromyalgia: Secondary | ICD-10-CM | POA: Insufficient documentation

## 2021-10-04 DIAGNOSIS — I10 Essential (primary) hypertension: Secondary | ICD-10-CM

## 2021-10-04 DIAGNOSIS — Z0000 Encounter for general adult medical examination without abnormal findings: Secondary | ICD-10-CM

## 2021-10-04 DIAGNOSIS — H60392 Other infective otitis externa, left ear: Secondary | ICD-10-CM

## 2021-10-04 MED ORDER — HYDROCHLOROTHIAZIDE 12.5 MG OR TABS
12.5000 mg | ORAL_TABLET | Freq: Every day | ORAL | 0 refills | Status: DC
Start: 2021-10-04 — End: 2021-11-03

## 2021-10-04 MED ORDER — CIPROFLOXACIN-DEXAMETHASONE 0.3-0.1 % OT SUSP
4.0000 [drp] | Freq: Two times a day (BID) | OTIC | 0 refills | Status: AC
Start: 2021-10-04 — End: ?

## 2021-10-04 MED ORDER — LOSARTAN POTASSIUM 100 MG OR TABS
100.0000 mg | ORAL_TABLET | Freq: Every day | ORAL | 0 refills | Status: DC
Start: 2021-10-04 — End: 2021-11-19

## 2021-10-04 NOTE — Patient Instructions (Addendum)
It was great to meet you today. As discussed,    For low potassium, decrease hydrochlorothiazide to 12.'5mg'$  and increase losartan to '100mg'$ . Stop potassium supplement. Recheck potassium levels in 2-3 weeks.   For night sweats, stop Paxil and Seroquel. Start gabapentin '100mg'$  at night, okay to increase by '100mg'$  every 2-3 nights as helpful for night sweats. Max dose = '600mg'$  to start.   Use antibiotic eardrops up to 10 days.  If symptoms not improve, let us know
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
## 2021-10-07 ENCOUNTER — Encounter (INDEPENDENT_AMBULATORY_CARE_PROVIDER_SITE_OTHER): Payer: Self-pay | Admitting: Nurse Practitioner

## 2021-10-07 DIAGNOSIS — N951 Menopausal and female climacteric states: Secondary | ICD-10-CM

## 2021-10-07 MED ORDER — GABAPENTIN 100 MG OR CAPS
ORAL_CAPSULE | ORAL | 0 refills | Status: DC
Start: 2021-10-07 — End: 2021-10-23

## 2021-10-07 NOTE — Telephone Encounter (Signed)
Please see message from pt. Pharmacy pended for your consideration.

## 2021-10-13 ENCOUNTER — Telehealth (HOSPITAL_BASED_OUTPATIENT_CLINIC_OR_DEPARTMENT_OTHER): Payer: Self-pay | Admitting: Obstetrics & Gynecology

## 2021-10-13 NOTE — Telephone Encounter (Signed)
LVM requesting a call back to schedule surgery with Dr. Tiwari.

## 2021-10-21 ENCOUNTER — Encounter (INDEPENDENT_AMBULATORY_CARE_PROVIDER_SITE_OTHER): Payer: Self-pay | Admitting: Nurse Practitioner

## 2021-10-22 NOTE — Telephone Encounter (Signed)
Please see MyChart and advise RE still having ear problems after doing drops for 10 days

## 2021-10-23 ENCOUNTER — Encounter (INDEPENDENT_AMBULATORY_CARE_PROVIDER_SITE_OTHER): Payer: Self-pay | Admitting: Nurse Practitioner

## 2021-10-23 DIAGNOSIS — N951 Menopausal and female climacteric states: Secondary | ICD-10-CM

## 2021-10-23 MED ORDER — GABAPENTIN 100 MG OR CAPS
ORAL_CAPSULE | ORAL | 0 refills | Status: DC
Start: 2021-10-23 — End: 2021-12-04

## 2021-10-28 ENCOUNTER — Telehealth (INDEPENDENT_AMBULATORY_CARE_PROVIDER_SITE_OTHER): Payer: Self-pay | Admitting: Nurse Practitioner

## 2021-10-28 DIAGNOSIS — F418 Other specified anxiety disorders: Secondary | ICD-10-CM

## 2021-10-28 NOTE — Telephone Encounter (Signed)
Patient called to request a refill of Wellbutrin, previously filled by outside provider     Patient confirms '150mg'$  bid     Patient does have upcoming appt but needs medication filled         (Patient did request in mychart message along with gabapentin but it was not addressed)       Pended

## 2021-10-29 MED ORDER — BUPROPION HCL ER (SR) 150 MG OR TB12
150.0000 mg | EXTENDED_RELEASE_TABLET | Freq: Two times a day (BID) | ORAL | 3 refills | Status: AC
Start: 2021-10-29 — End: ?

## 2021-10-30 ENCOUNTER — Other Ambulatory Visit (INDEPENDENT_AMBULATORY_CARE_PROVIDER_SITE_OTHER): Payer: Self-pay | Admitting: Nurse Practitioner

## 2021-10-30 DIAGNOSIS — E890 Postprocedural hypothyroidism: Secondary | ICD-10-CM

## 2021-10-30 DIAGNOSIS — F418 Other specified anxiety disorders: Secondary | ICD-10-CM

## 2021-10-30 DIAGNOSIS — K219 Gastro-esophageal reflux disease without esophagitis: Secondary | ICD-10-CM

## 2021-10-30 MED ORDER — LEVOTHYROXINE SODIUM 175 MCG OR TABS
175.0000 ug | ORAL_TABLET | Freq: Every day | ORAL | 1 refills | Status: AC
Start: 2021-10-30 — End: ?

## 2021-10-30 MED ORDER — OMEPRAZOLE 40 MG OR CPDR
40.0000 mg | DELAYED_RELEASE_CAPSULE | Freq: Every day | ORAL | 3 refills | Status: DC
Start: 2021-10-30 — End: 2021-12-04

## 2021-10-30 MED ORDER — DULOXETINE HCL 20 MG OR CPEP
60.0000 mg | DELAYED_RELEASE_CAPSULE | Freq: Every day | ORAL | 2 refills | Status: AC
Start: 2021-10-30 — End: ?

## 2021-10-30 NOTE — Telephone Encounter (Signed)
Patient called to request the remaining meds that were previously filled by outside provider    Confirmed doses-     Pended meds/pharmacy

## 2021-10-31 ENCOUNTER — Encounter (INDEPENDENT_AMBULATORY_CARE_PROVIDER_SITE_OTHER): Payer: No Typology Code available for payment source

## 2021-11-03 ENCOUNTER — Encounter (INDEPENDENT_AMBULATORY_CARE_PROVIDER_SITE_OTHER): Payer: Self-pay | Admitting: Nurse Practitioner

## 2021-11-03 ENCOUNTER — Telehealth (INDEPENDENT_AMBULATORY_CARE_PROVIDER_SITE_OTHER): Payer: Self-pay | Admitting: Nurse Practitioner

## 2021-11-03 ENCOUNTER — Emergency Department: Payer: Self-pay

## 2021-11-03 ENCOUNTER — Ambulatory Visit (INDEPENDENT_AMBULATORY_CARE_PROVIDER_SITE_OTHER): Payer: Commercial Managed Care - PPO | Admitting: Nurse Practitioner

## 2021-11-03 VITALS — BP 121/82 | HR 80 | Temp 98.3°F | Resp 18 | Wt 278.0 lb

## 2021-11-03 DIAGNOSIS — R1012 Left upper quadrant pain: Secondary | ICD-10-CM

## 2021-11-03 DIAGNOSIS — I10 Essential (primary) hypertension: Secondary | ICD-10-CM

## 2021-11-03 DIAGNOSIS — E876 Hypokalemia: Secondary | ICD-10-CM

## 2021-11-03 DIAGNOSIS — N951 Menopausal and female climacteric states: Secondary | ICD-10-CM

## 2021-11-03 LAB — BASIC METABOLIC PANEL
Anion Gap: 8 (ref 4–12)
Calcium: 9.5 mg/dL (ref 8.9–10.2)
Carbon Dioxide, Total: 32 meq/L (ref 22–32)
Chloride: 97 meq/L — ABNORMAL LOW (ref 98–108)
Creatinine: 1.01 mg/dL (ref 0.38–1.02)
Glucose: 90 mg/dL (ref 62–125)
Potassium: 3.2 meq/L — ABNORMAL LOW (ref 3.6–5.2)
Sodium: 137 meq/L (ref 135–145)
Urea Nitrogen: 10 mg/dL (ref 8–21)
eGFR by CKD-EPI 2021: >60 mL/min/{1.73_m2} (ref >59)

## 2021-11-03 MED ORDER — HYDROCHLOROTHIAZIDE 12.5 MG OR TABS
12.5000 mg | ORAL_TABLET | Freq: Every day | ORAL | 0 refills | Status: DC
Start: 2021-11-03 — End: 2021-12-04

## 2021-11-03 NOTE — Telephone Encounter (Signed)
RETURN CALL: Voicemail - Detailed Message      SUBJECT:  General Message     MESSAGE: Patient would like to speak with the clinic manager if possible about the care she received today. Patient states she was told that she wouldn't continue to get care unless she stopped being frustrated and feels like she wasn't treated like an adult. Please call patient to discuss, patient declined going thru patient relations.

## 2021-11-03 NOTE — Progress Notes (Signed)
Visit: f/u BP, med f/u    PCP Verified?  Yes, Lawernce Pitts, ARNP
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
## 2021-11-03 NOTE — Patient Instructions (Signed)
It was great to see you today. As discussed,    Get labs done today  Continue hydrochlorothiazide 12.'5mg'$  and losartan '100mg'$  daily - check blood pressure at home with goal less than 140/90  Get ultrasound of abdomen   Okay to restart gabapentin '500mg'$  at night as helpful for night sweats. If you continue to have diarrhea, let me know and we can try another medication

## 2021-11-04 ENCOUNTER — Encounter (INDEPENDENT_AMBULATORY_CARE_PROVIDER_SITE_OTHER): Payer: Self-pay | Admitting: Nurse Practitioner

## 2021-11-04 DIAGNOSIS — E876 Hypokalemia: Secondary | ICD-10-CM

## 2021-11-06 NOTE — Telephone Encounter (Signed)
2nd attempt. LVM requesting a call back to schedule surgery with Dr. Jaymes Graff.

## 2021-11-07 NOTE — Telephone Encounter (Signed)
It does not appear patient has responded to Mychart msg despite it is listed as "read." Can we reach out to patient to see if she is still taking potassium and to schedule a lab appt today or tomorrow for potassium recheck? Unfortunately I cannot speak to why there is a difference between our lab value and the ER lab value.     Thanks, Abigail Butts, [place]

## 2021-11-07 NOTE — Telephone Encounter (Addendum)
Patient notified of message from Lawernce Pitts, [place]. Patient stated that she doesn't want to do anything that would make her stop her  taking her potassium and not sure what she wants to do and hung up.

## 2021-11-10 NOTE — Telephone Encounter (Signed)
FYI to PCP. Patient notified and ended call with MA.

## 2021-11-10 NOTE — Telephone Encounter (Signed)
Mychart sent.

## 2021-11-10 NOTE — Telephone Encounter (Signed)
Noted. Please let pt know recommendation is to schedule lab appt this week to recheck potassium to make sure it is at the right level. Is she taking hctz 12.'5mg'$ ? She can schedule to see any provider in office but we want to make sure her blood pressure is stable with that dosage. If gabapentin is still giving her diarrhea, I recommend holding gabapentin since diarrhea can cause decrease in potassium.     Thanks, Abigail Butts, [place]

## 2021-11-10 NOTE — Telephone Encounter (Signed)
Sent [facility] with PCP's notification below.

## 2021-11-10 NOTE — Telephone Encounter (Signed)
[place] desk, please assist with lab scheduling for patient.

## 2021-11-15 ENCOUNTER — Encounter (HOSPITAL_COMMUNITY): Payer: Self-pay

## 2021-11-17 ENCOUNTER — Ambulatory Visit (INDEPENDENT_AMBULATORY_CARE_PROVIDER_SITE_OTHER): Payer: Commercial Managed Care - PPO

## 2021-11-19 ENCOUNTER — Other Ambulatory Visit (INDEPENDENT_AMBULATORY_CARE_PROVIDER_SITE_OTHER): Payer: Self-pay | Admitting: Nurse Practitioner

## 2021-11-19 ENCOUNTER — Other Ambulatory Visit: Payer: Self-pay

## 2021-11-19 DIAGNOSIS — I10 Essential (primary) hypertension: Secondary | ICD-10-CM

## 2021-11-20 MED ORDER — LOSARTAN POTASSIUM 100 MG OR TABS
100.0000 mg | ORAL_TABLET | Freq: Every day | ORAL | 0 refills | Status: AC
Start: 2021-11-20 — End: ?

## 2021-11-26 ENCOUNTER — Encounter (HOSPITAL_BASED_OUTPATIENT_CLINIC_OR_DEPARTMENT_OTHER): Payer: Self-pay | Admitting: Obstetrics & Gynecology

## 2021-11-26 NOTE — Telephone Encounter (Signed)
3rd attempt. LVM requesting a call back. Unable to reach patient to assist with schedule. Will cancel surgery order and a send letter. If patient decides to pursue surgery, she has our office number to be rescheduled.

## 2021-12-04 ENCOUNTER — Other Ambulatory Visit (INDEPENDENT_AMBULATORY_CARE_PROVIDER_SITE_OTHER): Payer: Self-pay | Admitting: Nurse Practitioner

## 2021-12-04 DIAGNOSIS — N951 Menopausal and female climacteric states: Secondary | ICD-10-CM

## 2021-12-04 DIAGNOSIS — I10 Essential (primary) hypertension: Secondary | ICD-10-CM

## 2021-12-04 DIAGNOSIS — K219 Gastro-esophageal reflux disease without esophagitis: Secondary | ICD-10-CM

## 2021-12-05 NOTE — Telephone Encounter (Signed)
This request falls outside refill center's protocols:     3 meds - Will defer to PCP for review of refill request in light of 11/04/21 email conversation.       If this medication is denied please have your staff inform the patient and schedule an appointment if necessary.

## 2021-12-06 MED ORDER — OMEPRAZOLE 40 MG OR CPDR
40.0000 mg | DELAYED_RELEASE_CAPSULE | Freq: Every day | ORAL | 2 refills | Status: AC
Start: 2021-12-06 — End: ?

## 2021-12-06 MED ORDER — HYDROCHLOROTHIAZIDE 12.5 MG OR TABS
12.5000 mg | ORAL_TABLET | Freq: Every day | ORAL | 0 refills | Status: AC
Start: 2021-12-06 — End: ?

## 2021-12-06 MED ORDER — GABAPENTIN 100 MG OR CAPS
ORAL_CAPSULE | ORAL | 0 refills | Status: AC
Start: 2021-12-06 — End: ?

## 2021-12-10 ENCOUNTER — Encounter (INDEPENDENT_AMBULATORY_CARE_PROVIDER_SITE_OTHER): Payer: Self-pay | Admitting: Obstetrics & Gynecology

## 2021-12-11 NOTE — Telephone Encounter (Signed)
Seen for dysmenorrhea 09/30/21 w/ Jaymes Graff: We will schedule for a hysteroscopy polypectomy    Pt asking to schedule, routing to provider

## 2021-12-12 ENCOUNTER — Telehealth: Payer: Self-pay | Admitting: Nurse Practitioner

## 2021-12-12 NOTE — Telephone Encounter (Signed)
Patient reporting dealing with pain symptom and requesting advice. Routing to RN Triage to evaluate. Thank you!    Clinic is closed.   FYI to provider.    Thank you!

## 2021-12-12 NOTE — Telephone Encounter (Signed)
Nicole [name], PSS/PSR   to [place] Nurse Triage  Nicole Alma, MD    [facility]    12/12/21 12:47 PM  Note     Patient reporting dealing with pain symptom and requesting advice. Routing to RN Triage to evaluate. Thank you!     Clinic is closed.   FYI to provider.     Thank you!

## 2021-12-12 NOTE — Telephone Encounter (Signed)
Patient requesting to schedule surgery. Clinic is closed for holiday. Patient said she is "dealing with pain." Please assist. Thank you.

## 2021-12-12 NOTE — Telephone Encounter (Signed)
Routing to OB-gyn pool

## 2021-12-17 NOTE — Telephone Encounter (Signed)
Called pt to triage "pain." Left vague VM with OBGYN nurse line for call back if pt still experiencing symptoms and wants to speak with a nurse.

## 2021-12-17 NOTE — Telephone Encounter (Signed)
Pt returned call, left VM on RN line. States she "needs to be booked for surgery with Dr.Tiwari." and also would like to be booked for depo shot. States "I see I have appt scheduled for 11/20, I don't remember booking that but YES I can make it for the shot."    MD Jaymes Graff pt is ready to schedule surgery. It looks like she signed consent for hysteroscopy/polypectomy on 09/30/21 but didn't end up going through with the surgery.     Routing to MD & S.Suong (surgery scheduler)    MyChart message sent to pt accordingly.

## 2021-12-18 ENCOUNTER — Encounter (HOSPITAL_BASED_OUTPATIENT_CLINIC_OR_DEPARTMENT_OTHER): Payer: Self-pay | Admitting: Obstetrics & Gynecology

## 2021-12-18 ENCOUNTER — Telehealth (INDEPENDENT_AMBULATORY_CARE_PROVIDER_SITE_OTHER): Payer: Self-pay | Admitting: Obstetrics & Gynecology

## 2021-12-18 ENCOUNTER — Telehealth (INDEPENDENT_AMBULATORY_CARE_PROVIDER_SITE_OTHER): Payer: Self-pay

## 2021-12-18 DIAGNOSIS — N841 Polyp of cervix uteri: Secondary | ICD-10-CM | POA: Insufficient documentation

## 2021-12-18 NOTE — Telephone Encounter (Signed)
I left a voicemail on the patient's main contact number offering possible procedure dates of 12/7, 12/22 with Dr. Jaymes Graff. Left direct contact number for Dr. Bryson Corona surgery scheduling line, [phone].

## 2021-12-18 NOTE — Telephone Encounter (Signed)
RETURN CALL: Voicemail - General Message      SUBJECT:  General Message     MESSAGE: Med assistant called to confirm whether a papsnear was done for patient around August 2023. CCR and Med assistant unable to locate any scheduled pap's. Please confirm and fax over records of the findings. Thank you!    FAX: [phone]

## 2021-12-18 NOTE — Telephone Encounter (Signed)
RETURN CALL: Voicemail - Detailed Message      SUBJECT:  General Message     MESSAGE: Patient is calling and states is frustrated she keeps calling to schedule her surgery but does not get through and wants a call back.

## 2021-12-22 ENCOUNTER — Ambulatory Visit (INDEPENDENT_AMBULATORY_CARE_PROVIDER_SITE_OTHER): Payer: Commercial Managed Care - PPO

## 2021-12-22 ENCOUNTER — Encounter (INDEPENDENT_AMBULATORY_CARE_PROVIDER_SITE_OTHER): Payer: Self-pay | Admitting: Obstetrics & Gynecology

## 2021-12-22 DIAGNOSIS — N946 Dysmenorrhea, unspecified: Secondary | ICD-10-CM

## 2021-12-22 NOTE — Patient Instructions (Signed)
Next Depo Provera injection due: 03/09/22 - 03/23/22

## 2021-12-22 NOTE — Progress Notes (Signed)
Nicole [name] is a 46 year old adult here for Depo Provera injection    Medication order in chart and ordered by Dr. Geryl Councilman.    Pregnancy test needed:  Yes. Patient is past due. Last day to get Depo Provera was 12/20/21.  Patient declined to have urine pregnancy test today, she stated she is a lesbian so no way she can be pregnant.     Last Depo Provera given on: 09/30/21.    Medication Given: Depo Provera  Injection site: left deltoid  See MAR for administration details  Next injection due: 03/09/22 - 03/23/22    Patient tolerated well, without initial adverse effect.   Manon Hilding, [place]

## 2021-12-23 ENCOUNTER — Other Ambulatory Visit: Payer: Self-pay

## 2021-12-24 NOTE — Telephone Encounter (Signed)
Sent scheduling ticket. Pending

## 2021-12-24 NOTE — Telephone Encounter (Signed)
Responded to patient and routed to provider and FD.

## 2021-12-24 NOTE — Telephone Encounter (Signed)
Routed to provider

## 2021-12-31 ENCOUNTER — Encounter (HOSPITAL_BASED_OUTPATIENT_CLINIC_OR_DEPARTMENT_OTHER): Payer: Self-pay | Admitting: Obstetrics & Gynecology

## 2022-01-06 ENCOUNTER — Other Ambulatory Visit: Payer: Self-pay

## 2022-01-09 ENCOUNTER — Ambulatory Visit (HOSPITAL_BASED_OUTPATIENT_CLINIC_OR_DEPARTMENT_OTHER): Payer: No Typology Code available for payment source | Admitting: Obstetrics & Gynecology

## 2022-01-09 ENCOUNTER — Ambulatory Visit: Payer: No Typology Code available for payment source | Admitting: Obstetrics & Gynecology

## 2022-01-09 DIAGNOSIS — N841 Polyp of cervix uteri: Secondary | ICD-10-CM

## 2022-01-09 DIAGNOSIS — N946 Dysmenorrhea, unspecified: Secondary | ICD-10-CM

## 2022-01-09 NOTE — Progress Notes (Signed)
[facility] Telemedicine Encounter  I conducted this encounter via secure, live, face-to-face video conference with the patient. I reviewed the risks and benefits of telemedicine as pertinent to this visit and the patient agreed to proceed.    Provider Location: On-site location (clinic, hospital, on-site office)  Patient Location: At home  Present with patient: No one else present    Patient is a 46 year old gravida 0 individual undergoing a hysteroscopy polypectomy on 12/22 for a polyp.  Patient has a long history of dysmenorrhea.  They had questions regarding if the dysmenorrhea would resolve with the removal of the polyp.  Discussed with patient that it is unlikely for the dysmenorrhea to resolve with the removal of the polyp.  Given that the patient's symptoms of dysmenorrhea have been present for some time.  They had questions regarding performing an endometrial ablation at the same time.  Reviewed with patient that endometrial ablation would likely worsen the dysmenorrhea.  Patient has options for continued Depo or hysterectomy.  Patient would like a Pap done at the time of the hysteroscopy.    Past medical history: Hypertension, GERD, anxiety and depression on duloxetine  Past surgical history: Tympanoplasty, cholecystectomy  OB history: Gravida 0  GYN history: No history of abnormal Pap.  Has had a colposcopy for a remote abnormal Pap    Study Result     Narrative & Impression   Indication  ========  Uterine cramping - r/o fibroids, other changes in uterine anatomy. Female partner only - low/no risk of pregnancy.  LMP: 08/16/2021        Method  ======  Transabdominal and transvaginal ultrasound examination was performed to evaluate pelvic structures. View: Suboptimal view: restricted by patient body habitus.        Uterus  ======  Uterus details:  The included length and volume are of the uterine corpus only and do not include the cervix. The uterus is normal in size and shape.  Uterus position:            anteverted  Description of uterine malformations:            none  Myometrium:   Multiple fibroids seen,  Endometrium:  within normal limits  Cervix details:  Echogenic mass of the anterior cervix wall with internal vascular flow, measuring 1.6 x 0.6 x 0.5 cm  Uterus length   52 mm  Uterus width    37 mm  Uterus height   30 mm  Uterus Vol        30.3 cm  Endometrial thickness, total    2.2 mm  Fibroids:           Fibroids identified  Uterine fibroid D1       4 mm  Uterine fibroid D2       7 mm  Uterine fibroid D3       2 mm  Uterine fibroid mean   4.3 mm  Uterine fibroid vol       0.029 cm  Doppler:  color score 1 (no color)  Uterine fibroids findings:         Subserosal  Uterine fibroid D1       17 mm  Uterine fibroid D2       17 mm  Uterine fibroid D3       14 mm  Uterine fibroid mean   16.0 mm  Uterine fibroid vol       2.118 cm  Doppler:  color score 2 (minimal color)  Uterine fibroids findings:         Subserosal, fundal        Right Ovary  =========  Rt ovary:          Not visualized        Left Ovary  ========  Lt ovary:           Not visualized        Cul de Sac  =========  No free fluid visualized        IMPRESSION  =========  2 small subserosal fundal fibroids that do not abut or displace the endometrium.  Echogenic mass anterior cervical wall with flow within it possibly small polyp.  Nonvisualized ovaries.  No free fluid.   Plan  Patient 46 year old with endocervical polyp.  Plan for hysteroscopy endometrial sampling and removal of polyp.  Plan to obtain cervical cytology.  We reviewed the process of a hysteroscopy being a vaginal procedure entering into the uterine cavity via the cervix.  Simultaneously removing the polyp and sampling the cavity.  Risks consist of but are not limited to bleeding, infection, injury to uterus, need for additional medical and or surgical interventions.  Patient has been will be present on the day of surgery.  Reviewed postop pain management consists of Tylenol and  ibuprofen.    Patient had questions if the hysterectomy could be done at the same time.  Reviewed with patient that a hysterectomy would require a separate preoperative and surgical consultation as the surgery is more complex.

## 2022-01-16 ENCOUNTER — Encounter (HOSPITAL_BASED_OUTPATIENT_CLINIC_OR_DEPARTMENT_OTHER): Payer: Self-pay | Admitting: Obstetrics & Gynecology

## 2022-01-16 NOTE — Telephone Encounter (Signed)
From Edison Nasuti at Pre-Anesthesia who contacted the CRNA, policy dictates if the pt was experiencing any upper respiratory illness, anesthesia would prefer that the surgery be re-scheduled out of concern for the pt's airway.    Notified pt via mychart.     Routing to provider and surgical scheduler.

## 2022-01-16 NOTE — Telephone Encounter (Signed)
[place] pre-anesthesia clinic.  Spoke to [place] PSS will follow up with pre-anesthesia team and reach out to [facility] and/or patient with plan.     Routing to provider.

## 2022-01-19 ENCOUNTER — Encounter (HOSPITAL_COMMUNITY): Payer: Self-pay

## 2022-01-19 ENCOUNTER — Ambulatory Visit (HOSPITAL_COMMUNITY): Payer: Commercial Managed Care - PPO

## 2022-01-19 ENCOUNTER — Encounter (HOSPITAL_BASED_OUTPATIENT_CLINIC_OR_DEPARTMENT_OTHER): Payer: Self-pay

## 2022-01-20 NOTE — Telephone Encounter (Signed)
Called Pt to notify her that the surgical scheduler had sent her a MyChart message with procedure date of January 26th.  Pt says that date will work for her.    Routing to provider and scheduler.

## 2022-01-21 ENCOUNTER — Other Ambulatory Visit: Payer: Self-pay

## 2022-01-21 NOTE — Progress Notes (Signed)
error 

## 2022-01-23 DIAGNOSIS — N841 Polyp of cervix uteri: Secondary | ICD-10-CM | POA: Insufficient documentation

## 2022-02-03 ENCOUNTER — Telehealth (INDEPENDENT_AMBULATORY_CARE_PROVIDER_SITE_OTHER): Payer: Commercial Managed Care - PPO | Admitting: Obstetrics & Gynecology

## 2022-02-04 ENCOUNTER — Encounter (HOSPITAL_BASED_OUTPATIENT_CLINIC_OR_DEPARTMENT_OTHER): Payer: Self-pay

## 2022-02-13 ENCOUNTER — Encounter (INDEPENDENT_AMBULATORY_CARE_PROVIDER_SITE_OTHER): Payer: Self-pay

## 2022-02-20 ENCOUNTER — Telehealth (HOSPITAL_COMMUNITY): Payer: Commercial Managed Care - PPO

## 2022-02-21 LAB — OTHER LAB ORDER
Hemoglobin A1C: 5.5 % (ref 4.8–5.6)
Mean Blood Glucose Estimate: 111 mg/dL

## 2022-02-27 ENCOUNTER — Ambulatory Visit (HOSPITAL_BASED_OUTPATIENT_CLINIC_OR_DEPARTMENT_OTHER)
Admission: RE | Admit: 2022-02-27 | Payer: Commercial Managed Care - PPO | Source: Home / Self Care | Admitting: Obstetrics & Gynecology

## 2022-02-27 ENCOUNTER — Encounter (HOSPITAL_BASED_OUTPATIENT_CLINIC_OR_DEPARTMENT_OTHER): Admission: RE | Payer: Self-pay | Source: Home / Self Care

## 2022-02-27 DIAGNOSIS — N841 Polyp of cervix uteri: Secondary | ICD-10-CM | POA: Insufficient documentation

## 2022-02-27 SURGERY — HYSTEROSCOPY, WITH UTERINE POLYP EXCISION, USING MORCELLATOR
Anesthesia: Surgeon Has No Preference | Site: Uterus

## 2022-03-10 ENCOUNTER — Telehealth (INDEPENDENT_AMBULATORY_CARE_PROVIDER_SITE_OTHER): Payer: Commercial Managed Care - PPO | Admitting: Obstetrics & Gynecology

## 2022-03-10 ENCOUNTER — Encounter (INDEPENDENT_AMBULATORY_CARE_PROVIDER_SITE_OTHER): Payer: Commercial Managed Care - PPO | Admitting: Nurse Practitioner

## 2022-06-29 ENCOUNTER — Other Ambulatory Visit (INDEPENDENT_AMBULATORY_CARE_PROVIDER_SITE_OTHER): Payer: Self-pay | Admitting: Nurse Practitioner

## 2022-06-29 DIAGNOSIS — K219 Gastro-esophageal reflux disease without esophagitis: Secondary | ICD-10-CM

## 2022-07-01 ENCOUNTER — Encounter (INDEPENDENT_AMBULATORY_CARE_PROVIDER_SITE_OTHER): Payer: Self-pay | Admitting: Nurse Practitioner

## 2022-07-01 NOTE — Telephone Encounter (Signed)
[employer] sent regarding billing department phone number

## 2022-07-01 NOTE — Telephone Encounter (Signed)
Encounter Details    Encounter Details  Date Type Department Care Team Description   01/12/2022 8:00 AM PST Office Visit [facility]   [address]   [place], [place] [address]   [phone]  Marin Shutter, DNP   [address]   [place], [place] [address]   [phone] (Work)   [phone] (Fax)  Annual physical exam (Primary Dx);  Hypertension, unspecified type;  Mood disorder (HCC);  Hypokalemia;  Hypothyroidism, postablative;  Vasomotor symptoms due to menopause;  Elevated blood sugar;  Encounter for screening mammogram for breast cancer

## 2023-04-08 ENCOUNTER — Encounter (INDEPENDENT_AMBULATORY_CARE_PROVIDER_SITE_OTHER): Payer: Self-pay

## 2023-12-07 ENCOUNTER — Encounter (HOSPITAL_BASED_OUTPATIENT_CLINIC_OR_DEPARTMENT_OTHER): Payer: Self-pay | Admitting: Obstetrics & Gynecology
